# Patient Record
Sex: Female | Born: 1985 | Race: Black or African American | Hispanic: No | Marital: Married | Smoking: Current some day smoker
Health system: Southern US, Community
[De-identification: ages and names within clinical notes are randomized; demographics above are authoritative.]

## PROBLEM LIST (undated history)

## (undated) DIAGNOSIS — F419 Anxiety disorder, unspecified: Secondary | ICD-10-CM

## (undated) DIAGNOSIS — F32A Depression, unspecified: Secondary | ICD-10-CM

## (undated) DIAGNOSIS — J45909 Unspecified asthma, uncomplicated: Secondary | ICD-10-CM

## (undated) DIAGNOSIS — F329 Major depressive disorder, single episode, unspecified: Secondary | ICD-10-CM

## (undated) HISTORY — PX: TUBAL LIGATION: SHX77

## (undated) HISTORY — DX: Depression, unspecified: F32.A

## (undated) HISTORY — DX: Anxiety disorder, unspecified: F41.9

---
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
## 2016-10-07 ENCOUNTER — Emergency Department
Admission: EM | Admit: 2016-10-07 | Discharge: 2016-10-07 | Disposition: A | Discharge status: Home / Self Care | Payer: Federal, State, Local not specified - PPO | Attending: Emergency Medicine | Admitting: Emergency Medicine

## 2016-10-07 DIAGNOSIS — R110 Nausea: Secondary | ICD-10-CM | POA: Insufficient documentation

## 2016-10-07 DIAGNOSIS — X501XXA Overexertion from prolonged static or awkward postures, initial encounter: Secondary | ICD-10-CM | POA: Insufficient documentation

## 2016-10-07 DIAGNOSIS — Y9389 Activity, other specified: Secondary | ICD-10-CM | POA: Diagnosis not present

## 2016-10-07 DIAGNOSIS — M62830 Muscle spasm of back: Secondary | ICD-10-CM

## 2016-10-07 DIAGNOSIS — J45909 Unspecified asthma, uncomplicated: Secondary | ICD-10-CM | POA: Diagnosis not present

## 2016-10-07 DIAGNOSIS — Y999 Unspecified external cause status: Secondary | ICD-10-CM | POA: Insufficient documentation

## 2016-10-07 DIAGNOSIS — S29012A Strain of muscle and tendon of back wall of thorax, initial encounter: Secondary | ICD-10-CM | POA: Diagnosis not present

## 2016-10-07 DIAGNOSIS — F17200 Nicotine dependence, unspecified, uncomplicated: Secondary | ICD-10-CM | POA: Insufficient documentation

## 2016-10-07 DIAGNOSIS — Y929 Unspecified place or not applicable: Secondary | ICD-10-CM | POA: Insufficient documentation

## 2016-10-07 DIAGNOSIS — T148XXA Other injury of unspecified body region, initial encounter: Secondary | ICD-10-CM

## 2016-10-07 DIAGNOSIS — S299XXA Unspecified injury of thorax, initial encounter: Secondary | ICD-10-CM | POA: Diagnosis present

## 2016-10-07 HISTORY — DX: Unspecified asthma, uncomplicated: J45.909

## 2016-10-07 LAB — URINALYSIS COMPLETE WITH MICROSCOPIC (ARMC ONLY)
BACTERIA UA: NONE SEEN
Bilirubin Urine: NEGATIVE
GLUCOSE, UA: NEGATIVE mg/dL
HGB URINE DIPSTICK: NEGATIVE
Ketones, ur: NEGATIVE mg/dL
Leukocytes, UA: NEGATIVE
Nitrite: NEGATIVE
Protein, ur: NEGATIVE mg/dL
Specific Gravity, Urine: 1.028 (ref 1.005–1.030)
pH: 6 (ref 5.0–8.0)

## 2016-10-07 LAB — POCT PREGNANCY, URINE: PREG TEST UR: NEGATIVE

## 2016-10-07 MED ORDER — KETOROLAC TROMETHAMINE 30 MG/ML IJ SOLN
30.0000 mg | Freq: Once | INTRAMUSCULAR | Status: AC
  Administered 2016-10-07: 30 mg via INTRAMUSCULAR

## 2016-10-07 MED ORDER — ONDANSETRON 4 MG PO TBDP
4.0000 mg | ORAL_TABLET | Freq: Once | ORAL | Status: AC
  Administered 2016-10-07: 4 mg via ORAL
  Filled 2016-10-07: qty 1

## 2016-10-07 MED ORDER — NAPROXEN 500 MG PO TABS: 500.0000 mg | ORAL_TABLET | Freq: Two times a day (BID) | ORAL | 0 refills | Status: DC

## 2016-10-07 MED ORDER — CYCLOBENZAPRINE HCL 10 MG PO TABS: 10.0000 mg | ORAL_TABLET | Freq: Three times a day (TID) | ORAL | 0 refills | Status: DC | PRN

## 2016-10-07 MED ORDER — KETOROLAC TROMETHAMINE 30 MG/ML IJ SOLN
INTRAMUSCULAR | Status: AC
  Administered 2016-10-07: 30 mg via INTRAMUSCULAR
  Filled 2016-10-07: qty 1

## 2016-10-07 NOTE — ED Provider Notes (Signed)
[facility] Emergency Department Provider Note  ____________________________________________  Time seen: Approximately 8:35 AM  I have reviewed the triage vital signs and the nursing notes.   HISTORY  Chief Complaint Back Pain    HPI Kaitlyn Fry is a 30 y.o. female, NAD,  who presents to the emergency department for a one day history of left back pain. Patient reports that the pain began suddenly yesterday after she bent over to pick up a toy at the child's birthday party. States since that time the pain has increased and has not been alleviated by taking BC powder or applying heating pads. The pain is in the left mid back and does not radiate. Her husband who is at the bedside states he noted an "knot" about the left back that he attempted to massage last night. She states that the pain is similar to kidney stone pain that she had several years ago but has not noted any changes in urination patterns, dysuria or hematuria. Has had some mild nausea but states this can be chronic in nature for her. Denies abdominal pain, vomiting or diarrhea. Has had no chest pain or shortness of breath. Has not noted any rashes or skin sores. Denies headaches or neck pain.   Past Medical History:  Diagnosis Date  . Asthma     There are no active problems to display for this patient.   History reviewed. No pertinent surgical history.  Prior to Admission medications   Medication Sig Start Date End Date Taking? Authorizing Provider  cyclobenzaprine (FLEXERIL) 10 MG tablet Take 1 tablet (10 mg total) by mouth 3 (three) times daily as needed for muscle spasms. 10/07/16   Jami L Hagler, PA-C  naproxen (NAPROSYN) 500 MG tablet Take 1 tablet (500 mg total) by mouth 2 (two) times daily with a meal. 10/07/16   Jami L Hagler, PA-C    Allergies Patient has no known allergies.  No family history on file.  Social History Social History  Substance Use Topics  . Smoking status:  Current Some Day Smoker  . Smokeless tobacco: Never Used  . Alcohol use Yes     Review of Systems  Constitutional: No fever/chills Cardiovascular: No chest pain. Respiratory:  No shortness of breath. No wheezing.  Gastrointestinal: Positive for nausea. No abdominal pain, vomiting.  No diarrhea.  No constipation. Genitourinary: Negative for dysuria, hematuria. No urinary hesitancy, urgency or increased frequency. Musculoskeletal: Positive left sided back pain without radiation. Skin: Negative for rash, redness, swelling, bruising, skin sores. Neurological: Negative for headaches. No numbness, weakness, tingling. 10-point ROS otherwise negative.  ____________________________________________   PHYSICAL EXAM:  VITAL SIGNS: ED Triage Vitals [10/07/16 0833]  Enc Vitals Group     BP 106/70     Pulse Rate 73     Resp 18     Temp 97.7 F (36.5 C)     Temp Source Oral     SpO2 100 %     Weight      Height      Head Circumference      Peak Flow      Pain Score      Pain Loc      Pain Edu?      Excl. in GC?      Constitutional: Alert and oriented. Well appearing and in no acute distress. Eyes: Conjunctivae are normal.  Head: Atraumatic.   Neck: No cervical spine tenderness to palpation. Supple with full range of motion. Cardiovascular: Normal rate, regular  rhythm. Normal S1 and S2.  Good peripheral circulation. Respiratory: Normal respiratory effort without tachypnea or retractions. Lungs CTABWith breath sounds noted in all lung fields. No wheeze, rhonchi, rales. Gastrointestinal: Soft and nontender without distention or guarding in all quadrants. No rebound or rigidity. Normoactive bowel sounds heard in all quadrants.  No CVA tenderness. Musculoskeletal: Tenderness to palpation of the left mid back with diffuse left back muscle spasm. Strength of bilateral lower extremities is 5 out of 5 and equal. No lower extremity tenderness nor edema.  No joint effusions. Neurologic:   Normal speech and language. No gross focal neurologic deficits are appreciated.  Skin:  Skin is warm, dry and intact. No rash, redness, swelling, bruising, skin sores noted. Psychiatric: Mood and affect are normal. Speech and behavior are normal. Patient exhibits appropriate insight and judgement.   ____________________________________________   LABS (all labs ordered are listed, but only abnormal results are displayed)  Labs Reviewed  URINALYSIS COMPLETEWITH MICROSCOPIC ([facility] ONLY) - Abnormal; Notable for the following:       Result Value   Color, Urine YELLOW (*)    APPearance CLEAR (*)    Squamous Epithelial / LPF 0-5 (*)    All other components within normal limits  POC URINE PREG, ED  POCT PREGNANCY, URINE   ____________________________________________  EKG  None ____________________________________________  RADIOLOGY  None ____________________________________________    PROCEDURES  Procedure(s) performed: None   Procedures   Medications  ondansetron (ZOFRAN-ODT) disintegrating tablet 4 mg (4 mg Oral Given 10/07/16 0921)  ketorolac (TORADOL) 30 MG/ML injection 30 mg (30 mg Intramuscular Given 10/07/16 0959)     ____________________________________________   INITIAL IMPRESSION / ASSESSMENT AND PLAN / ED COURSE  Pertinent labs & imaging results that were available during my care of the patient were reviewed by me and considered in my medical decision making (see chart for details).  Clinical Course     Patient's diagnosis is consistent with muscle spasm and strain of back. Patient will be discharged home with prescriptions for flexeril and naprosyn. Patient tolerated Toradol IM which was given in the emergency department prior to discharge. Patient [facility] if symptoms persist past this treatment course. Patient is given ED precautions to return to the ED for any worsening or new symptoms.     ____________________________________________  FINAL CLINICAL IMPRESSION(S) / ED DIAGNOSES  Final diagnoses:  Muscle spasm of back  Muscle strain      NEW MEDICATIONS STARTED DURING THIS VISIT:  Discharge Medication List as of 10/07/2016  9:57 AM    START taking these medications   Details  cyclobenzaprine (FLEXERIL) 10 MG tablet Take 1 tablet (10 mg total) by mouth 3 (three) times daily as needed for muscle spasms., Starting Mon 10/07/2016, Print    naproxen (NAPROSYN) 500 MG tablet Take 1 tablet (500 mg total) by mouth 2 (two) times daily with a meal., Starting Mon 10/07/2016, Print             Hope PigeonJami L Hagler, PA-C 10/07/16 1037    Sharman CheekPhillip Stafford, MD 10/08/16 0745

## 2016-10-07 NOTE — ED Triage Notes (Signed)
Pt c/o left flank into lower back pain since yesterday, denies injury..Marland Kitchen

## 2017-03-04 ENCOUNTER — Ambulatory Visit: Payer: Self-pay | Admitting: Obstetrics and Gynecology

## 2017-07-28 ENCOUNTER — Emergency Department
Admission: EM | Admit: 2017-07-28 | Discharge: 2017-07-28 | Disposition: A | Discharge status: Home / Self Care | Payer: Federal, State, Local not specified - PPO | Attending: Emergency Medicine | Admitting: Emergency Medicine

## 2017-07-28 ENCOUNTER — Encounter: Payer: Self-pay | Admitting: Emergency Medicine

## 2017-07-28 DIAGNOSIS — K529 Noninfective gastroenteritis and colitis, unspecified: Secondary | ICD-10-CM | POA: Insufficient documentation

## 2017-07-28 DIAGNOSIS — R112 Nausea with vomiting, unspecified: Secondary | ICD-10-CM | POA: Diagnosis present

## 2017-07-28 DIAGNOSIS — J45909 Unspecified asthma, uncomplicated: Secondary | ICD-10-CM | POA: Insufficient documentation

## 2017-07-28 DIAGNOSIS — Z79899 Other long term (current) drug therapy: Secondary | ICD-10-CM | POA: Diagnosis not present

## 2017-07-28 DIAGNOSIS — F17200 Nicotine dependence, unspecified, uncomplicated: Secondary | ICD-10-CM | POA: Insufficient documentation

## 2017-07-28 MED ORDER — SODIUM CHLORIDE 0.9 % IV SOLN: 1000.0000 mL | Freq: Once | INTRAVENOUS | Status: DC

## 2017-07-28 MED ORDER — ONDANSETRON 4 MG PO TBDP: 4.0000 mg | ORAL_TABLET | Freq: Once | ORAL | Status: DC

## 2017-07-28 MED ORDER — ONDANSETRON 4 MG PO TBDP: 4.0000 mg | ORAL_TABLET | Freq: Three times a day (TID) | ORAL | 0 refills | Status: DC | PRN

## 2017-07-28 NOTE — ED Notes (Addendum)
This RN explained to patient that MD placed orders for blood work and for patient to receive IV fluids due to N/V. Pt states "You're going to have to butterfly me and start an IV." This RN explained that blood work could be obtained when starting an IV. Pt then states, "No I'm telling you you're going to have to use a butterfly because I'm a hard stick". This RN explained that if a butterfly was used then patient would not be able to receive IV fluids through it nor would any tests be able to be done should they be ordered. Pt then states, "what's the smallest needle you have, I don't do needles and I'm a hard stick!" This RN attempted to explain that a 22g would also suffice for any potential meds/testing that MD may order, pt states, "that's not what I asked you, I asked you what's the smallest gauge needle you have!" This RN explained the smallest gauge possible was one used for infants, pt states "well that's the one you're going to use on me!" This RN spoke with MD regarding patient demands, per MD hold off on drawing blood at this time.

## 2017-07-28 NOTE — ED Notes (Signed)
Pt requesting to speak with charge at this time, Zollie Scale, RN at bedside to speak with patient.

## 2017-07-28 NOTE — ED Notes (Addendum)
Tammy Sours, RN at bedside at this time to speak with patient regarding patient complaints.

## 2017-07-28 NOTE — ED Notes (Signed)
MD to bedside at this time 

## 2017-07-28 NOTE — ED Triage Notes (Signed)
Pt to ed with c/o abd pain, n/v/d that started last night.

## 2017-07-28 NOTE — ED Notes (Signed)
Pt refused to sign for discharge papers, discharge papers given to pt, pt refused discharge vitals

## 2017-07-28 NOTE — ED Notes (Signed)
Pt requesting to speak to someone in charge concerning care received while here.  This nurse arrived to room and sat at the foot of the stretcher with mother patient present at bedside. Kaitlyn Fry also present in room. Pt complaining of the change in care that was initiated on arrival that was stated by [place] Fry (primary Fry), pt stated "I am a hard stick" and requested a small needle, the pt interpreted the response from [facility] as "rude" with the way she placed the IV supplies down on the counter.  Kaitlyn Fry left the room to assist with another pt and during this time the MD (Dr.Kinner) arrived to the room for evaluation, per the pt" the Dr stated that we were no longer going to obtain blood work due to being difficult stick before he even evaluated me",  The patient stated " you all are just pushing me out and not treating me",  This Fry listened to the patient side and then this Fry explained the process that we do in the ED, the pt was informed that we have Nurse driven protocols that can be started before MD eval, and on some occasions when the MD does there evaluation sometimes those protocols are not needed and can be canceled.  The patient stated " the doctor said before evaluating me that the blood work is canceled".  After having this conversation the patient excused the Fry and Kaitlyn Fry from the room and requested her discharge instruction, and stating " you all are not treating because I am black "

## 2017-07-28 NOTE — ED Notes (Signed)
Pt standing at nursing desk requesting names of staff who interacted with her.  Pt requesting to speak to the director of the ED, pt was informed that this RN was one of the directors here and present currently.  Pt requesting to speak to someone higher than this RN. Pt was given the phone number of "office of patient experience" [phone] , pt refused at first and said I want to speak to someone, this RN informed patient that this is the process where any and all complaints of care go through and that someone there will follow up with the complaint. Pt verbalized understanding, received the number and then ambulated to the lobby.

## 2017-07-28 NOTE — ED Provider Notes (Addendum)
[facility] Emergency Department Provider Note   ____________________________________________    I have reviewed the triage vital signs and the nursing notes.   HISTORY  Chief Complaint Emesis     HPI Kaitlyn Fry is a 31 y.o. female who presents with complaints of nausea vomiting and diarrhea which started at approximately 4 AM. Patient reports she thinks this was related to the food that she ate last night. She says after she ate dinner she did not feel very well and went to sleep soon thereafter, she woke up at 4 with nausea. She reports she has vomited 4 times this morning, she has also had watery diarrhea.   Past Medical History:  Diagnosis Date  . Asthma     There are no active problems to display for this patient.   History reviewed. No pertinent surgical history.  Prior to Admission medications   Medication Sig Start Date End Date Taking? Authorizing Provider  cyclobenzaprine (FLEXERIL) 10 MG tablet Take 1 tablet (10 mg total) by mouth 3 (three) times daily as needed for muscle spasms. 10/07/16   Hagler, Jami L, PA-C  naproxen (NAPROSYN) 500 MG tablet Take 1 tablet (500 mg total) by mouth 2 (two) times daily with a meal. 10/07/16   Hagler, Jami L, PA-C  ondansetron (ZOFRAN ODT) 4 MG disintegrating tablet Take 1 tablet (4 mg total) by mouth every 8 (eight) hours as needed for nausea or vomiting. 07/28/17   Jene Every, MD     Allergies Patient has no known allergies.  History reviewed. No pertinent family history.  Social History Social History  Substance Use Topics  . Smoking status: Current Some Day Smoker  . Smokeless tobacco: Never Used  . Alcohol use Yes    Review of Systems  Constitutional: No fever/chills   Gastrointestinal: Mild abdominal cramping upper abdomen Genitourinary: Negative for dysuria. Musculoskeletal: Positive myalgias    ____________________________________________   PHYSICAL EXAM:  VITAL  SIGNS: ED Triage Vitals  Enc Vitals Group     BP 07/28/17 0933 126/68     Pulse Rate 07/28/17 0933 67     Resp 07/28/17 0933 16     Temp 07/28/17 0933 (!) 89.2 F (31.8 C)     Temp Source 07/28/17 0933 Oral     SpO2 07/28/17 0933 98 %     Weight 07/28/17 0934 74.8 kg (165 lb)     Height --      Head Circumference --      Peak Flow --      Pain Score 07/28/17 0933 10     Pain Loc --      Pain Edu? --      Excl. in GC? --     Constitutional: Alert and oriented. No acute distress.     Nose: No congestion/rhinnorhea. Mouth/Throat: Mucous membranes are moist.    Cardiovascular: Normal rate, regular rhythm.   Good peripheral circulation. Respiratory: Normal respiratory effort.  No retractions.  Gastrointestinal: Soft and nontender. No distention.  Genitourinary: deferred Musculoskeletal:  Warm and well perfused Neurologic:  Normal speech and language. No gross focal neurologic deficits are appreciated.  Skin:  Skin is warm, dry and intact. No rash noted. Psychiatric: Mood and affect are normal. Speech and behavior are normal.  ____________________________________________   LABS (all labs ordered are listed, but only abnormal results are displayed)  Labs Reviewed - No data to display ____________________________________________  EKG  None ____________________________________________  RADIOLOGY  None ____________________________________________   PROCEDURES  Procedure(s) performed: No    Critical Care performed: No ____________________________________________   INITIAL IMPRESSION / ASSESSMENT AND PLAN / ED COURSE  Pertinent labs & imaging results that were available during my care of the patient were reviewed by me and considered in my medical decision making (see chart for details).  Patient well-appearing and in no acute distress. Initial temperature was erroneous measurement, she is not hyperthermic.  Abdomen exam is normal. Symptoms most consistent  with likely viral gastroenteritis as she reports mild myalgias. No sick contacts reported. No recent travel. Patient has a fear of needles, we will avoid labs as I do not think it would be beneficial and give by mouth Zofran and treat symptoms.   ----------------------------------------- 11:01 AM on 07/28/2017 -----------------------------------------  Patient is apparently upset that no labs were done however given her exam is not medically necessary, this was explained to her.     ____________________________________________   FINAL CLINICAL IMPRESSION(S) / ED DIAGNOSES  Final diagnoses:  Gastroenteritis      NEW MEDICATIONS STARTED DURING THIS VISIT:  New Prescriptions   ONDANSETRON (ZOFRAN ODT) 4 MG DISINTEGRATING TABLET    Take 1 tablet (4 mg total) by mouth every 8 (eight) hours as needed for nausea or vomiting.     Note:  This document was prepared using Dragon voice recognition software and may include unintentional dictation errors.    Jene Every, MD 07/28/17 1035    Jene Every, MD 07/28/17 1101

## 2017-10-24 ENCOUNTER — Ambulatory Visit
Admission: RE | Admit: 2017-10-24 | Payer: Federal, State, Local not specified - PPO | Source: Ambulatory Visit | Admitting: Unknown Physician Specialty

## 2017-10-24 ENCOUNTER — Encounter: Admission: RE | Payer: Self-pay | Source: Ambulatory Visit

## 2017-10-24 SURGERY — TONSILLECTOMY
Anesthesia: General

## 2018-02-02 DIAGNOSIS — Z113 Encounter for screening for infections with a predominantly sexual mode of transmission: Secondary | ICD-10-CM | POA: Diagnosis not present

## 2018-02-10 DIAGNOSIS — M25562 Pain in left knee: Secondary | ICD-10-CM | POA: Diagnosis not present

## 2018-02-10 DIAGNOSIS — M25561 Pain in right knee: Secondary | ICD-10-CM | POA: Diagnosis not present

## 2018-02-20 DIAGNOSIS — G43909 Migraine, unspecified, not intractable, without status migrainosus: Secondary | ICD-10-CM | POA: Diagnosis not present

## 2018-02-20 DIAGNOSIS — R1031 Right lower quadrant pain: Secondary | ICD-10-CM | POA: Diagnosis not present

## 2018-03-23 DIAGNOSIS — J45998 Other asthma: Secondary | ICD-10-CM | POA: Diagnosis not present

## 2018-04-16 DIAGNOSIS — Y939 Activity, unspecified: Secondary | ICD-10-CM | POA: Diagnosis not present

## 2018-04-16 DIAGNOSIS — S63501A Unspecified sprain of right wrist, initial encounter: Secondary | ICD-10-CM | POA: Diagnosis not present

## 2018-04-17 DIAGNOSIS — J358 Other chronic diseases of tonsils and adenoids: Secondary | ICD-10-CM | POA: Diagnosis not present

## 2018-04-17 DIAGNOSIS — F17210 Nicotine dependence, cigarettes, uncomplicated: Secondary | ICD-10-CM | POA: Diagnosis not present

## 2018-05-11 DIAGNOSIS — K080 Exfoliation of teeth due to systemic causes: Secondary | ICD-10-CM | POA: Diagnosis not present

## 2018-06-03 DIAGNOSIS — G43909 Migraine, unspecified, not intractable, without status migrainosus: Secondary | ICD-10-CM | POA: Diagnosis not present

## 2018-06-03 DIAGNOSIS — F4310 Post-traumatic stress disorder, unspecified: Secondary | ICD-10-CM | POA: Diagnosis not present

## 2018-06-03 DIAGNOSIS — R112 Nausea with vomiting, unspecified: Secondary | ICD-10-CM | POA: Diagnosis not present

## 2018-06-16 ENCOUNTER — Ambulatory Visit (INDEPENDENT_AMBULATORY_CARE_PROVIDER_SITE_OTHER): Payer: Federal, State, Local not specified - PPO | Admitting: Podiatry

## 2018-06-16 ENCOUNTER — Ambulatory Visit: Payer: Non-veteran care

## 2018-06-16 ENCOUNTER — Encounter: Payer: Self-pay | Admitting: Podiatry

## 2018-06-16 DIAGNOSIS — M2141 Flat foot [pes planus] (acquired), right foot: Secondary | ICD-10-CM

## 2018-06-16 DIAGNOSIS — B353 Tinea pedis: Secondary | ICD-10-CM

## 2018-06-16 DIAGNOSIS — B351 Tinea unguium: Secondary | ICD-10-CM

## 2018-06-16 DIAGNOSIS — M2142 Flat foot [pes planus] (acquired), left foot: Principal | ICD-10-CM

## 2018-06-16 MED ORDER — TERBINAFINE HCL 250 MG PO TABS: 250.0000 mg | ORAL_TABLET | Freq: Every day | ORAL | 0 refills | Status: DC

## 2018-06-19 NOTE — Progress Notes (Signed)
   Subjective:  32 year old female presenting today with a chief complaint of constant throbbing pain of bilateral feet, left worse than right, that has been ongoing for several years. She is also concerned for fungal nails and athlete's foot of the left foot. She has not done anything to treat the fungus but has been taking Ibuprofen, applying OTC foot pain creams and ice therapy for the pain. Patient is here for further evaluation and treatment.   Past Medical History:  Diagnosis Date  . Asthma        Objective/Physical Exam General: The patient is alert and oriented x3 in no acute distress.  Dermatology: Pruritus to the interdigital areas of the left foot with hyperkeratosis. Hyperkeratotic, discolored, thickened, onychodystrophy of nails noted bilaterally. Skin is warm, dry and supple bilateral lower extremities. Negative for open lesions or macerations.  Vascular: Palpable pedal pulses bilaterally. No edema or erythema noted. Capillary refill within normal limits.  Neurological: Epicritic and protective threshold grossly intact bilaterally.   Musculoskeletal Exam: Range of motion within normal limits to all pedal and ankle joints bilateral. Muscle strength 5/5 in all groups bilateral.  Upon weightbearing there is a medial longitudinal arch collapse bilaterally. Remove foot valgus noted to the bilateral lower extremities with excessive pronation upon mid stance.  Radiographic Exam:  Normal osseous mineralization. Joint spaces preserved. No fracture/dislocation/boney destruction.   Pes planus noted on radiographic exam lateral views. Decreased calcaneal inclination and metatarsal declination angle is noted. Anterior break in the cyma line noted on lateral views. Medial talar head to deviation noted on AP radiograph.   Assessment: 1. pes planus bilateral 2. Onychomycosis bilateral nails 1-5 3. Tinea pedis left    Plan of Care:  1. Patient was evaluated. X-Rays reviewed.  2.  Prescription for custom molded orthotics given to [facility].  3. Prescription for Lamisil 250 mg #90 provided to patient.  4. Recommended good shoe gear.  5. Return to clinic as needed.     Felecia ShellingBrent M. Anndee Connett, DPM [facility]  Dr. Felecia ShellingBrent M. Selicia Windom, DPM    [address]                                        [place], [place] [address]                Office [phone]  Fax [phone]

## 2018-06-22 ENCOUNTER — Emergency Department: Payer: Federal, State, Local not specified - PPO

## 2018-06-22 ENCOUNTER — Other Ambulatory Visit: Payer: Self-pay

## 2018-06-22 ENCOUNTER — Encounter: Payer: Self-pay | Admitting: Emergency Medicine

## 2018-06-22 ENCOUNTER — Emergency Department
Admission: EM | Admit: 2018-06-22 | Discharge: 2018-06-22 | Disposition: A | Discharge status: Home / Self Care | Payer: Federal, State, Local not specified - PPO | Attending: Emergency Medicine | Admitting: Emergency Medicine

## 2018-06-22 DIAGNOSIS — J45909 Unspecified asthma, uncomplicated: Secondary | ICD-10-CM | POA: Insufficient documentation

## 2018-06-22 DIAGNOSIS — F17200 Nicotine dependence, unspecified, uncomplicated: Secondary | ICD-10-CM | POA: Insufficient documentation

## 2018-06-22 DIAGNOSIS — N3090 Cystitis, unspecified without hematuria: Secondary | ICD-10-CM

## 2018-06-22 DIAGNOSIS — T6291XA Toxic effect of unspecified noxious substance eaten as food, accidental (unintentional), initial encounter: Secondary | ICD-10-CM | POA: Diagnosis not present

## 2018-06-22 DIAGNOSIS — R1111 Vomiting without nausea: Secondary | ICD-10-CM | POA: Diagnosis not present

## 2018-06-22 DIAGNOSIS — IMO0001 Reserved for inherently not codable concepts without codable children: Secondary | ICD-10-CM

## 2018-06-22 LAB — POCT PREGNANCY, URINE: PREG TEST UR: NEGATIVE

## 2018-06-22 LAB — URINALYSIS, COMPLETE (UACMP) WITH MICROSCOPIC
BILIRUBIN URINE: NEGATIVE
Glucose, UA: NEGATIVE mg/dL
Ketones, ur: NEGATIVE mg/dL
Nitrite: NEGATIVE
PROTEIN: NEGATIVE mg/dL
SPECIFIC GRAVITY, URINE: 1.019 (ref 1.005–1.030)
pH: 5 (ref 5.0–8.0)

## 2018-06-22 MED ORDER — ONDANSETRON 4 MG PO TBDP: 4.0000 mg | ORAL_TABLET | Freq: Three times a day (TID) | ORAL | 0 refills | Status: DC | PRN

## 2018-06-22 MED ORDER — CEPHALEXIN 500 MG PO CAPS: 500.0000 mg | ORAL_CAPSULE | Freq: Two times a day (BID) | ORAL | 0 refills | Status: AC

## 2018-06-22 MED ORDER — GI COCKTAIL ~~LOC~~
30.0000 mL | Freq: Once | ORAL | Status: AC
  Administered 2018-06-22: 30 mL via ORAL
  Filled 2018-06-22: qty 30

## 2018-06-22 MED ORDER — GI COCKTAIL ~~LOC~~
ORAL | Status: AC
  Administered 2018-06-22: 30 mL via ORAL
  Filled 2018-06-22: qty 30

## 2018-06-22 NOTE — ED Provider Notes (Signed)
[facility] Emergency Department Provider Note  ____________________________________________  Time seen: Approximately 1:01 PM  I have reviewed the triage vital signs and the nursing notes.   HISTORY  Chief Complaint Emesis and Diarrhea    HPI Kaitlyn Fry is a 32 y.o. female that presents emergency department for evaluation of vomiting and diarrhea for 2 days.  Patient has had 2 episodes of vomiting this morning and 3 episodes of diarrhea.  No blood or mucus in diarrhea.  She has had some overall abdominal discomfort, worse in the upper abdomen.  Symptoms started when patient was [facility] [employer] yesterday.  She had cod and scallops.  She had originally requested substitution for scallops because she gets sick with scallops but ended up eating the scallops.  No fever, chills, urinary symptoms, vaginal discharge, back pain.  Past Medical History:  Diagnosis Date  . Asthma     There are no active problems to display for this patient.   History reviewed. No pertinent surgical history.  Prior to Admission medications   Medication Sig Start Date End Date Taking? Authorizing Provider  cephALEXin (KEFLEX) 500 MG capsule Take 1 capsule (500 mg total) by mouth 2 (two) times daily for 10 days. 06/22/18 07/02/18  Enid DerryWagner, Tremeka Helbling, PA-C  ondansetron (ZOFRAN ODT) 4 MG disintegrating tablet Take 1 tablet (4 mg total) by mouth every 8 (eight) hours as needed for nausea or vomiting. 06/22/18   Enid DerryWagner, Katlyn Muldrew, PA-C  terbinafine (LAMISIL) 250 MG tablet Take 1 tablet (250 mg total) by mouth daily. 06/16/18   Felecia ShellingEvans, Brent M, DPM    Allergies Patient has no known allergies.  No family history on file.  Social History Social History   Tobacco Use  . Smoking status: Current Some Day Smoker  . Smokeless tobacco: Never Used  Substance Use Topics  . Alcohol use: Yes  . Drug use: No     Review of Systems  Constitutional: No fever/chills Cardiovascular: No chest  pain. Respiratory: No SOB. Gastrointestinal: Positive for nausea, no vomiting.  Musculoskeletal: Negative for musculoskeletal pain. Skin: Negative for rash, abrasions, lacerations, ecchymosis. Neurological: Negative for headaches, numbness or tingling   ____________________________________________   PHYSICAL EXAM:  VITAL SIGNS: ED Triage Vitals [06/22/18 1200]  Enc Vitals Group     BP 119/80     Pulse Rate 78     Resp 20     Temp 99 F (37.2 C)     Temp Source Oral     SpO2 100 %     Weight 156 lb (70.8 kg)     Height 5' (1.524 m)     Head Circumference      Peak Flow      Pain Score 0     Pain Loc      Pain Edu?      Excl. in GC?      Constitutional: Alert and oriented. Well appearing and in no acute distress. Eyes: Conjunctivae are normal. PERRL. EOMI. Head: Atraumatic. ENT:      Ears:      Nose: No congestion/rhinnorhea.      Mouth/Throat: Mucous membranes are moist.  Neck: No stridor. Cardiovascular: Normal rate, regular rhythm.  Good peripheral circulation. Respiratory: Normal respiratory effort without tachypnea or retractions. Lungs CTAB. Good air entry to the bases with no decreased or absent breath sounds. Gastrointestinal: Bowel sounds 4 quadrants. Soft and nontender to palpation. No guarding or rigidity. No palpable masses. No distention. No CVA tenderness. Musculoskeletal: Full range of motion  to all extremities. No gross deformities appreciated. Neurologic:  Normal speech and language. No gross focal neurologic deficits are appreciated.  Skin:  Skin is warm, dry and intact. No rash noted. Psychiatric: Mood and affect are normal. Speech and behavior are normal. Patient exhibits appropriate insight and judgement.   ____________________________________________   LABS (all labs ordered are listed, but only abnormal results are displayed)  Labs Reviewed  URINALYSIS, COMPLETE (UACMP) WITH MICROSCOPIC - Abnormal; Notable for the following components:       Result Value   Color, Urine YELLOW (*)    APPearance CLOUDY (*)    Hgb urine dipstick SMALL (*)    Leukocytes, UA LARGE (*)    Bacteria, UA RARE (*)    All other components within normal limits  URINE CULTURE  POC URINE PREG, ED  POCT PREGNANCY, URINE   ____________________________________________  EKG   ____________________________________________  RADIOLOGY Lexine Baton, personally viewed and evaluated these images (plain radiographs) as part of my medical decision making, as well as reviewing the written report by the radiologist.  Ct Renal Stone Study  Result Date: 06/22/2018 CLINICAL DATA:  Loss E a, vomiting and diarrhea since yesterday. Hematuria. EXAM: CT ABDOMEN AND PELVIS WITHOUT CONTRAST TECHNIQUE: Multidetector CT imaging of the abdomen and pelvis was performed following the standard protocol without IV contrast. COMPARISON:  None. FINDINGS: Lower chest: No acute abnormality. Hepatobiliary: No focal liver abnormality is seen. No gallstones, gallbladder wall thickening, or biliary dilatation. Pancreas: Unremarkable. No pancreatic ductal dilatation or surrounding inflammatory changes. Spleen: Normal in size without focal abnormality. Adrenals/Urinary Tract: Adrenal glands are unremarkable. Kidneys are normal, without renal calculi, focal lesion, or hydronephrosis. Bladder is unremarkable. Stomach/Bowel: Stomach is within normal limits. Appendix appears normal. No evidence of bowel wall thickening, distention, or inflammatory changes. Vascular/Lymphatic: No significant vascular findings are present. No enlarged abdominal or pelvic lymph nodes. Reproductive: The uterus is normal. There are cysts within the left ovary. Other: Small amount of free fluid is identified in the pelvis. Pelvic phleboliths are identified. Musculoskeletal: No acute or significant osseous findings. IMPRESSION: No acute abnormality identified in the abdomen pelvis. No bowel obstruction or diverticulitis.   The appendix is normal. No hydronephrosis bilaterally. Cysts within the left ovary, likely follicular. Small amount of free fluid identified in the pelvis, likely physiologic and may be in part due to recently ruptured follicular cyst. Electronically Signed   By: Sherian Rein M.D.   On: 06/22/2018 13:51    ____________________________________________    PROCEDURES  Procedure(s) performed:    Procedures    Medications  gi cocktail (Maalox,Lidocaine,Donnatal) (30 mLs Oral Given 06/22/18 1432)     ____________________________________________   INITIAL IMPRESSION / ASSESSMENT AND PLAN / ED COURSE  Pertinent labs & imaging results that were available during my care of the patient were reviewed by me and considered in my medical decision making (see chart for details).  Review of the [place] CSRS was performed in accordance of the [facility] prior to dispensing any controlled drugs.     Patient's diagnosis is consistent with food poisoning and urinary tract infection.  Vital signs and exam are reassuring.  Urinalysis consistent with infection.  No nephrolithiasis on CT.  CT results were given to patient.  Patient has not had any vomiting or diarrhea while in the emergency department.  She is able to keep fluids down without vomiting.  Abdomen is soft and nontender.  Symptoms are likely related to the scallops she ate yesterday.  Symptoms improved with  GI cocktail.  Patient will be discharged home with prescriptions for Zofran and Keflex. Patient is to follow up with primary care as directed. Patient is given ED precautions to return to the ED for any worsening or new symptoms.     ____________________________________________  FINAL CLINICAL IMPRESSION(S) / ED DIAGNOSES  Final diagnoses:  Food poisoning, accidental or unintentional, initial encounter  Cystitis      NEW MEDICATIONS STARTED DURING THIS VISIT:  ED Discharge Orders         Ordered    ondansetron (ZOFRAN ODT) 4 MG  disintegrating tablet  Every 8 hours PRN     06/22/18 1507    cephALEXin (KEFLEX) 500 MG capsule  2 times daily     06/22/18 1507              This chart was dictated using voice recognition software/Dragon. Despite best efforts to proofread, errors can occur which can change the meaning. Any change was purely unintentional.    Enid DerryWagner, Adeoluwa Silvers, PA-C 06/22/18 [id number]    Kinner, Robert, MD 06/23/18 [phone]

## 2018-06-22 NOTE — ED Triage Notes (Signed)
States began vomiting about 10 minutes after finished eating at restaurant yesterday. Quickly began diarrhea after. Denies abd pain. Believes it was the food.

## 2018-06-22 NOTE — ED Notes (Signed)
See triage note  Presents with some n/v/d   States it developed after eating yesterday  Last time vomited ot diarrhea was this am  Low grade fever noted on arrival

## 2018-06-22 NOTE — ED Notes (Signed)
See triage note  Presents with 

## 2018-06-24 LAB — URINE CULTURE

## 2018-07-08 ENCOUNTER — Telehealth: Payer: Self-pay | Admitting: Podiatry

## 2018-07-08 NOTE — Telephone Encounter (Signed)
[facility], Attn:[facility] Team B fax Number [phone].

## 2018-08-07 DIAGNOSIS — R109 Unspecified abdominal pain: Secondary | ICD-10-CM | POA: Diagnosis not present

## 2018-08-07 DIAGNOSIS — A599 Trichomoniasis, unspecified: Secondary | ICD-10-CM | POA: Diagnosis not present

## 2018-08-07 DIAGNOSIS — N760 Acute vaginitis: Secondary | ICD-10-CM | POA: Diagnosis not present

## 2018-08-07 DIAGNOSIS — H9319 Tinnitus, unspecified ear: Secondary | ICD-10-CM | POA: Diagnosis not present

## 2018-08-08 DIAGNOSIS — A5901 Trichomonal vulvovaginitis: Secondary | ICD-10-CM | POA: Diagnosis not present

## 2018-08-25 DIAGNOSIS — K080 Exfoliation of teeth due to systemic causes: Secondary | ICD-10-CM | POA: Diagnosis not present

## 2018-12-16 DIAGNOSIS — R509 Fever, unspecified: Secondary | ICD-10-CM | POA: Diagnosis not present

## 2018-12-16 DIAGNOSIS — J069 Acute upper respiratory infection, unspecified: Secondary | ICD-10-CM | POA: Diagnosis not present

## 2018-12-28 DIAGNOSIS — K080 Exfoliation of teeth due to systemic causes: Secondary | ICD-10-CM | POA: Diagnosis not present

## 2018-12-29 DIAGNOSIS — J029 Acute pharyngitis, unspecified: Secondary | ICD-10-CM | POA: Diagnosis not present

## 2019-04-20 DIAGNOSIS — N390 Urinary tract infection, site not specified: Secondary | ICD-10-CM | POA: Diagnosis not present

## 2019-06-05 DIAGNOSIS — M545 Low back pain: Secondary | ICD-10-CM | POA: Diagnosis not present

## 2019-07-23 DIAGNOSIS — K080 Exfoliation of teeth due to systemic causes: Secondary | ICD-10-CM | POA: Diagnosis not present

## 2019-10-18 ENCOUNTER — Ambulatory Visit: Payer: Non-veteran care | Admitting: Physician Assistant

## 2019-11-04 ENCOUNTER — Ambulatory Visit: Payer: Non-veteran care | Admitting: Physician Assistant

## 2019-11-08 ENCOUNTER — Ambulatory Visit (INDEPENDENT_AMBULATORY_CARE_PROVIDER_SITE_OTHER): Payer: Federal, State, Local not specified - PPO | Admitting: Adult Health

## 2019-11-08 ENCOUNTER — Encounter: Payer: Self-pay | Admitting: Adult Health

## 2019-11-08 ENCOUNTER — Other Ambulatory Visit: Payer: Self-pay

## 2019-11-08 VITALS — BP 122/80 | HR 62 | Temp 97.8°F | Resp 15 | Ht 60.0 in | Wt 160.0 lb

## 2019-11-08 DIAGNOSIS — F419 Anxiety disorder, unspecified: Secondary | ICD-10-CM

## 2019-11-08 DIAGNOSIS — Z6831 Body mass index (BMI) 31.0-31.9, adult: Secondary | ICD-10-CM

## 2019-11-08 DIAGNOSIS — Z0000 Encounter for general adult medical examination without abnormal findings: Secondary | ICD-10-CM | POA: Diagnosis not present

## 2019-11-08 DIAGNOSIS — N920 Excessive and frequent menstruation with regular cycle: Secondary | ICD-10-CM | POA: Insufficient documentation

## 2019-11-08 DIAGNOSIS — R102 Pelvic and perineal pain: Secondary | ICD-10-CM

## 2019-11-08 DIAGNOSIS — Z8742 Personal history of other diseases of the female genital tract: Secondary | ICD-10-CM

## 2019-11-08 DIAGNOSIS — R87619 Unspecified abnormal cytological findings in specimens from cervix uteri: Secondary | ICD-10-CM

## 2019-11-08 DIAGNOSIS — Z13220 Encounter for screening for lipoid disorders: Secondary | ICD-10-CM | POA: Diagnosis not present

## 2019-11-08 DIAGNOSIS — Z9851 Tubal ligation status: Secondary | ICD-10-CM

## 2019-11-08 DIAGNOSIS — F17200 Nicotine dependence, unspecified, uncomplicated: Secondary | ICD-10-CM

## 2019-11-08 DIAGNOSIS — N939 Abnormal uterine and vaginal bleeding, unspecified: Secondary | ICD-10-CM | POA: Insufficient documentation

## 2019-11-08 NOTE — Progress Notes (Addendum)
Patient: Kaitlyn Fry, Female    DOB: Nov 26, 1985, 34 y.o.   MRN: [id number] Visit Date: 11/08/2019  Today's Provider: Jairo Ben, FNP   Chief Complaint  Patient presents with  . Establish Care   Subjective:    New Patient  Kaitlyn Fry is a 33 y.o. female who presents today for health maintenance and establish care, patient reports that her former PCP was at [place] in [place] and states that her last pap was more then a year ago but was normal. She feels well. She reports she is not exercising . She reports she is sleeping fairly well on average 3-5hrs a night.   She has white thicker vaginal discharge before her menstrual cycle, discussed likely etiology.  Denies any vaginal symptoms. She denies any concerns for STD's. She does report history of abnormal pap. She has a history of abnormal PAP in past- she is unsure as to what was abnormal. Offered patient a record release she declined.  She reports she always has had " stomach issues " since having tubal ligation. 2012. She She had upper GI she reports in 2014 was told was normal by gastrointestinal MD at [facility] hospital. She reports she was told no follow up needed.   Denies any rectal or abnormal vaginal bleeding.  Pain not associated with eating or not eating. Denies reflux or burning. Denies constipation. Denies any trauma or injury.   No birth controls. She denies any concerns for STD's. Heavy menstraul cycles.    She has amitriptyline for anxiety and she was given that at the [place]. She had a counselor there.  She denies any need for refills. She reports she is doing well. She has no suicidal or homicidal ideations or intents.   Patient  denies any fever, body aches,chills, rash, chest pain, shortness of breath, nausea, vomiting, or diarrhea.   -----------------------------------------------------------------   Review of Systems  Constitutional: Negative.   HENT: Negative.   Eyes: Negative.   Respiratory:  Negative.   Cardiovascular: Negative.   Gastrointestinal: Positive for abdominal pain. Negative for abdominal distention, anal bleeding, blood in stool, constipation, diarrhea, nausea, rectal pain and vomiting.  Endocrine: Negative.   Genitourinary: Positive for vaginal discharge. Negative for decreased urine volume, difficulty urinating, dyspareunia, dysuria, enuresis, flank pain, frequency, genital sores, hematuria, menstrual problem, pelvic pain, urgency, vaginal bleeding and vaginal pain.  Musculoskeletal: Negative.   Skin: Negative.   Neurological: Negative.   Hematological: Negative.   Psychiatric/Behavioral: Negative for agitation, behavioral problems, confusion, decreased concentration, dysphoric mood, hallucinations, self-injury, sleep disturbance and suicidal ideas. The patient is nervous/anxious. The patient is not hyperactive.   All other systems reviewed and are negative.   Social History She  reports that she has been smoking. She has never used smokeless tobacco. She reports current alcohol use. She reports that she does not use drugs. Social History   Socioeconomic History  . Marital status: Married    Spouse name: Not on file  . Number of children: Not on file  . Years of education: Not on file  . Highest education level: Not on file  Occupational History  . Not on file  Tobacco Use  . Smoking status: Current Some Day Smoker  . Smokeless tobacco: Never Used  Substance and Sexual Activity  . Alcohol use: Yes  . Drug use: No  . Sexual activity: Not on file  Other Topics Concern  . Not on file  Social History Narrative  . Not on file  Social Determinants of Health   Financial Resource Strain:   . Difficulty of Paying Living Expenses: Not on file  Food Insecurity:   . Worried About Programme researcher, broadcasting/film/video in the Last Year: Not on file  . Ran Out of Food in the Last Year: Not on file  Transportation Needs:   . Lack of Transportation (Medical): Not on file  .  Lack of Transportation (Non-Medical): Not on file  Physical Activity:   . Days of Exercise per Week: Not on file  . Minutes of Exercise per Session: Not on file  Stress:   . Feeling of Stress : Not on file  Social Connections:   . Frequency of Communication with Friends and Family: Not on file  . Frequency of Social Gatherings with Friends and Family: Not on file  . Attends Religious Services: Not on file  . Active Member of Clubs or Organizations: Not on file  . Attends Banker Meetings: Not on file  . Marital Status: Not on file    Patient Active Problem List   Diagnosis Date Noted  . Abnormal uterine bleeding (AUB) 11/08/2019  . Pelvic pain in female 11/08/2019    Past Surgical History:  Procedure Laterality Date  . TUBAL LIGATION      Family History  No family status information on file.   Her family history is not on file.     No Known Allergies  Previous Medications   No medications on file    Patient Care Team: Flinchum, Eula Fried, FNP as PCP - General (Family Medicine)      Objective:   Today's Vitals   11/08/19 1030  BP: 122/80  Pulse: 62  Resp: 15  Temp: 97.8 F (36.6 C)  TempSrc: Oral  SpO2: 97%  Weight: 160 lb (72.6 kg)  Height: 5' (1.524 m)   Body mass index is 31.25 kg/m.;  Physical Exam Vitals reviewed.  Constitutional:      General: She is not in acute distress.    Appearance: She is well-developed. She is not diaphoretic.     Interventions: She is not intubated. HENT:     Head: Normocephalic and atraumatic.     Right Ear: External ear normal.     Left Ear: External ear normal.     Nose: Nose normal.     Mouth/Throat:     Pharynx: No oropharyngeal exudate.  Eyes:     General: Lids are normal. No scleral icterus.       Right eye: No discharge.        Left eye: No discharge.     Conjunctiva/sclera: Conjunctivae normal.     Right eye: Right conjunctiva is not injected. No exudate or hemorrhage.    Left eye: Left  conjunctiva is not injected. No exudate or hemorrhage.    Pupils: Pupils are equal, round, and reactive to light.  Neck:     Thyroid: No thyroid mass or thyromegaly.     Vascular: Normal carotid pulses. No carotid bruit, hepatojugular reflux or JVD.     Trachea: Trachea and phonation normal. No tracheal tenderness or tracheal deviation.     Meningeal: Brudzinski's sign and Kernig's sign absent.  Cardiovascular:     Rate and Rhythm: Normal rate and regular rhythm.     Pulses: Normal pulses.          Radial pulses are 2+ on the right side and 2+ on the left side.       Dorsalis pedis pulses  are 2+ on the right side and 2+ on the left side.       Posterior tibial pulses are 2+ on the right side and 2+ on the left side.     Heart sounds: Normal heart sounds, S1 normal and S2 normal. Heart sounds not distant. No murmur. No friction rub. No gallop.   Pulmonary:     Effort: Pulmonary effort is normal. No tachypnea, bradypnea, accessory muscle usage or respiratory distress. She is not intubated.     Breath sounds: Normal breath sounds. No stridor. No wheezing or rales.  Chest:     Chest wall: No tenderness.  Abdominal:     General: Bowel sounds are normal. There is no distension or abdominal bruit.     Palpations: Abdomen is soft. There is no shifting dullness, fluid wave, hepatomegaly, splenomegaly, mass or pulsatile mass.     Tenderness: There is no abdominal tenderness. There is no guarding or rebound.     Hernia: No hernia is present.  Genitourinary:    Comments: Declined/ deferred will see gynecology for breast and PAP smear/ vaginal exam  Musculoskeletal:        General: No tenderness or deformity. Normal range of motion.     Cervical back: Full passive range of motion without pain, normal range of motion and neck supple. No edema, erythema or rigidity. No spinous process tenderness or muscular tenderness. Normal range of motion.  Lymphadenopathy:     Head:     Right side of head: No  submental, submandibular, tonsillar, preauricular, posterior auricular or occipital adenopathy.     Left side of head: No submental, submandibular, tonsillar, preauricular, posterior auricular or occipital adenopathy.     Cervical: No cervical adenopathy.     Right cervical: No superficial, deep or posterior cervical adenopathy.    Left cervical: No superficial, deep or posterior cervical adenopathy.     Upper Body:     Right upper body: No supraclavicular or pectoral adenopathy.     Left upper body: No supraclavicular or pectoral adenopathy.  Skin:    General: Skin is warm and dry.     Coloration: Skin is not pale.     Findings: No abrasion, bruising, burn, ecchymosis, erythema, lesion, petechiae or rash.     Nails: There is no clubbing.  Neurological:     Mental Status: She is alert and oriented to person, place, and time.     GCS: GCS eye subscore is 4. GCS verbal subscore is 5. GCS motor subscore is 6.     Cranial Nerves: No cranial nerve deficit.     Sensory: No sensory deficit.     Motor: No tremor, atrophy, abnormal muscle tone or seizure activity.     Coordination: Coordination normal.     Gait: Gait normal.     Deep Tendon Reflexes: Reflexes are normal and symmetric. Reflexes normal. Babinski sign absent on the right side. Babinski sign absent on the left side.     Reflex Scores:      Tricep reflexes are 2+ on the right side and 2+ on the left side.      Bicep reflexes are 2+ on the right side and 2+ on the left side.      Brachioradialis reflexes are 2+ on the right side and 2+ on the left side.      Patellar reflexes are 2+ on the right side and 2+ on the left side.      Achilles reflexes are 2+ on the  right side and 2+ on the left side. Psychiatric:        Mood and Affect: Mood normal.        Speech: Speech normal.        Behavior: Behavior normal.        Thought Content: Thought content normal.        Judgment: Judgment normal.      Depression Screen PHQ 2/9 Scores  11/08/2019  PHQ - 2 Score 2  PHQ- 9 Score 14       Assessment & Plan:     Routine Health Maintenance and Physical Exam  Exercise Activities and Dietary recommendations Goals   None      There is no immunization history on file for this patient.  Health Maintenance  Topic Date Due  . HIV Screening  12/30/1999  . TETANUS/TDAP  12/30/2003  . PAP SMEAR-Modifier  12/29/2005  . INFLUENZA VACCINE  06/12/2019     Discussed health benefits of physical activity, and encouraged her to engage in regular exercise appropriate for her age and condition.    -------------------------------------------------------------------- 1. Routine adult health maintenance Labs today. Health maintenance discussed.  Healthy lifestyle/excercise.  - CBC with Differential/Platelet 005009 - Comprehensive Metabolic Panel (CMET) - TSH - Lipid panel - Ambulatory referral to Psychiatry  2. Abnormal cervical Papanicolaou smear, unspecified abnormal pap finding Reported patient unsure etiology in past. - Ambulatory referral to Obstetrics / Gynecology  3. Menorrhagia with regular cycle Chronic history will send to specialty.  - Ambulatory referral to Obstetrics / Gynecology  4. Smoker Smoking cessation advised, health risks associated with smoking and increased risk of cardiovascular disease and lung cancer. Patient not interested in quitting at this time.  - Ambulatory referral to Obstetrics / Gynecology  5. BMI 31.0-31.9,adult Diet / healthy lifestyle/ exercise recommended.   6. Personal history of ovarian cyst  - Ambulatory referral to Obstetrics / Gynecology  7. Pelvic pain in female Chronic  - Ambulatory referral to Obstetrics / Gynecology  8. History of tubal ligation - Ambulatory referral to Obstetrics / Gynecology  9. Anxiety Reviewed PHQ and GAD scoring. She is not interested in medication change/ alternatives  She is taking Amitriptyline daily for years, does not have bottle.  Denies the need for refill. She would like to see psychiatry again. Counseling recommended. Also may take a while to get in can also check [facility] in [place], while waiting on psychiatry appointment. She is advised to follow up with the office if any worsening symptoms or concerns.  - Ambulatory referral to Psychiatry  Discussed calling the office  if she has not heard from referrals within 2 weeks.  Return if symptoms worsen or fail to improve, for Go to Emergency room/ urgent care if worse, at any time for any worsening symptoms.  The entirety of the information documented in the History of Present Illness, Review of Systems and Physical Exam were personally obtained by me. Portions of this information were initially documented by the  Certified Medical Assistant whose name is documented in [place] and reviewed by me for thoroughness and accuracy.  I have personally performed the exam and reviewed the chart and it is accurate to the best of my knowledge.  Haematologist has been used and any errors in dictation or transcription are unintentional.  Kelby Aline. [facility]

## 2019-11-08 NOTE — Patient Instructions (Addendum)
Health Maintenance, Female Adopting a healthy lifestyle and getting preventive care are important in promoting health and wellness. Ask your health care provider about:  The right schedule for you to have regular tests and exams.  Things you can do on your own to prevent diseases and keep yourself healthy. What should I know about diet, weight, and exercise? Eat a healthy diet   Eat a diet that includes plenty of vegetables, fruits, low-fat dairy products, and lean protein.  Do not eat a lot of foods that are high in solid fats, added sugars, or sodium. Maintain a healthy weight Body mass index (BMI) is used to identify weight problems. It estimates body fat based on height and weight. Your health care provider can help determine your BMI and help you achieve or maintain a healthy weight. Get regular exercise Get regular exercise. This is one of the most important things you can do for your health. Most adults should:  Exercise for at least 150 minutes each week. The exercise should increase your heart rate and make you sweat (moderate-intensity exercise).  Do strengthening exercises at least twice a week. This is in addition to the moderate-intensity exercise.  Spend less time sitting. Even light physical activity can be beneficial. Watch cholesterol and blood lipids Have your blood tested for lipids and cholesterol at 34 years of age, then have this test every 5 years. Have your cholesterol levels checked more often if:  Your lipid or cholesterol levels are high.  You are older than 34 years of age.  You are at high risk for heart disease. What should I know about cancer screening? Depending on your health history and family history, you may need to have cancer screening at various ages. This may include screening for:  Breast cancer.  Cervical cancer.  Colorectal cancer.  Skin cancer.  Lung cancer. What should I know about heart disease, diabetes, and high blood  pressure? Blood pressure and heart disease  High blood pressure causes heart disease and increases the risk of stroke. This is more likely to develop in people who have high blood pressure readings, are of African descent, or are overweight.  Have your blood pressure checked: ? Every 3-5 years if you are 18-39 years of age. ? Every year if you are 40 years old or older. Diabetes Have regular diabetes screenings. This checks your fasting blood sugar level. Have the screening done:  Once every three years after age 40 if you are at a normal weight and have a low risk for diabetes.  More often and at a younger age if you are overweight or have a high risk for diabetes. What should I know about preventing infection? Hepatitis B If you have a higher risk for hepatitis B, you should be screened for this virus. Talk with your health care provider to find out if you are at risk for hepatitis B infection. Hepatitis C Testing is recommended for:  Everyone born from 1945 through 1965.  Anyone with known risk factors for hepatitis C. Sexually transmitted infections (STIs)  Get screened for STIs, including gonorrhea and chlamydia, if: ? You are sexually active and are younger than 34 years of age. ? You are older than 34 years of age and your health care provider tells you that you are at risk for this type of infection. ? Your sexual activity has changed since you were last screened, and you are at increased risk for chlamydia or gonorrhea. Ask your health care provider if   you are at risk.  Ask your health care provider about whether you are at high risk for HIV. Your health care provider may recommend a prescription medicine to help prevent HIV infection. If you choose to take medicine to prevent HIV, you should first get tested for HIV. You should then be tested every 3 months for as long as you are taking the medicine. Pregnancy  If you are about to stop having your period (premenopausal) and  you may become pregnant, seek counseling before you get pregnant.  Take 400 to 800 micrograms (mcg) of folic acid every day if you become pregnant.  Ask for birth control (contraception) if you want to prevent pregnancy. Osteoporosis and menopause Osteoporosis is a disease in which the bones lose minerals and strength with aging. This can result in bone fractures. If you are 56 years old or older, or if you are at risk for osteoporosis and fractures, ask your health care provider if you should:  Be screened for bone loss.  Take a calcium or vitamin D supplement to lower your risk of fractures.  Be given hormone replacement therapy (HRT) to treat symptoms of menopause. Follow these instructions at home: Lifestyle  Do not use any products that contain nicotine or tobacco, such as cigarettes, e-cigarettes, and chewing tobacco. If you need help quitting, ask your health care provider.  Do not use street drugs.  Do not share needles.  Ask your health care provider for help if you need support or information about quitting drugs. Alcohol use  Do not drink alcohol if: ? Your health care provider tells you not to drink. ? You are pregnant, may be pregnant, or are planning to become pregnant.  If you drink alcohol: ? Limit how much you use to 0-1 drink a day. ? Limit intake if you are breastfeeding.  Be aware of how much alcohol is in your drink. In the [place] one drink equals one 12 oz bottle of beer (355 mL), one 5 oz glass of wine (148 mL), or one 1 oz glass of hard liquor (44 mL). General instructions  Schedule regular health, dental, and eye exams.  Stay current with your vaccines.  Tell your health care provider if: ? You often feel depressed. ? You have ever been abused or do not feel safe at home. Summary  Adopting a healthy lifestyle and getting preventive care are important in promoting health and wellness.  Follow your health care provider's instructions about healthy  diet, exercising, and getting tested or screened for diseases.  Follow your health care provider's instructions on monitoring your cholesterol and blood pressure. This information is not intended to replace advice given to you by your health care provider. Make sure you discuss any questions you have with your health care provider. Document Released: 05/13/2011 Document Revised: 10/21/2018 Document Reviewed: 10/21/2018 Elsevier Patient Education  2020 [employer] Pelvic Pain, Female Pelvic pain is pain in your lower belly (abdomen), below your belly button and between your hips. The pain may start suddenly (be acute), keep coming back (be recurring), or last a long time (become chronic). Pelvic pain that lasts longer than 6 months is called chronic pelvic pain. There are many causes of pelvic pain. Sometimes the cause of pelvic pain is not known. Follow these instructions at home:   Take over-the-counter and prescription medicines only as told by your doctor.  Rest as told by your doctor.  Do not have sex if it hurts.  Keep a journal of  your pelvic pain. Write down: ? When the pain started. ? Where the pain is located. ? What seems to make the pain better or worse, such as food or your period (menstrual cycle). ? Any symptoms you have along with the pain.  Keep all follow-up visits as told by your doctor. This is important. Contact a doctor if:  Medicine does not help your pain.  Your pain comes back.  You have new symptoms.  You have unusual discharge or bleeding from your vagina.  You have a fever or chills.  You are having trouble pooping (constipation).  You have blood in your pee (urine) or poop (stool).  Your pee smells bad.  You feel weak or light-headed. Get help right away if:  You have sudden pain that is very bad.  Your pain keeps getting worse.  You have very bad pain and also have any of these symptoms: ? A fever. ? Feeling sick to your stomach  (nausea). ? Throwing up (vomiting). ? Being very sweaty.  You pass out (lose consciousness). Summary  Pelvic pain is pain in your lower belly (abdomen), below your belly button and between your hips.  There are many possible causes of pelvic pain.  Keep a journal of your pelvic pain. This information is not intended to replace advice given to you by your health care provider. Make sure you discuss any questions you have with your health care provider. Document Released: 04/15/2008 Document Revised: 04/15/2018 Document Reviewed: 04/15/2018 Elsevier Patient Education  2020 [employer].

## 2019-11-09 LAB — COMPREHENSIVE METABOLIC PANEL
ALT: 13 IU/L (ref 0–32)
AST: 18 IU/L (ref 0–40)
Albumin/Globulin Ratio: 1.5 (ref 1.2–2.2)
Albumin: 4.5 g/dL (ref 3.8–4.8)
Alkaline Phosphatase: 62 IU/L (ref 39–117)
BUN/Creatinine Ratio: 12 (ref 9–23)
BUN: 9 mg/dL (ref 6–20)
Bilirubin Total: 0.5 mg/dL (ref 0.0–1.2)
CO2: 20 mmol/L (ref 20–29)
Calcium: 9.5 mg/dL (ref 8.7–10.2)
Chloride: 104 mmol/L (ref 96–106)
Creatinine, Ser: 0.76 mg/dL (ref 0.57–1.00)
GFR calc Af Amer: 118 mL/min/{1.73_m2} (ref >59)
GFR calc non Af Amer: 103 mL/min/{1.73_m2} (ref >59)
Globulin, Total: 3.1 g/dL (ref 1.5–4.5)
Glucose: 82 mg/dL (ref 65–99)
Potassium: 4.2 mmol/L (ref 3.5–5.2)
Sodium: 137 mmol/L (ref 134–144)
Total Protein: 7.6 g/dL (ref 6.0–8.5)

## 2019-11-09 LAB — CBC WITH DIFFERENTIAL/PLATELET
Basophils Absolute: 0 10*3/uL (ref 0.0–0.2)
Basos: 0 %
EOS (ABSOLUTE): 0.1 10*3/uL (ref 0.0–0.4)
Eos: 1 %
Hematocrit: 42 % (ref 34.0–46.6)
Hemoglobin: 13.7 g/dL (ref 11.1–15.9)
Immature Grans (Abs): 0 10*3/uL (ref 0.0–0.1)
Immature Granulocytes: 0 %
Lymphocytes Absolute: 3 10*3/uL (ref 0.7–3.1)
Lymphs: 34 %
MCH: 28.4 pg (ref 26.6–33.0)
MCHC: 32.6 g/dL (ref 31.5–35.7)
MCV: 87 fL (ref 79–97)
Monocytes Absolute: 0.4 10*3/uL (ref 0.1–0.9)
Monocytes: 5 %
Neutrophils Absolute: 5.3 10*3/uL (ref 1.4–7.0)
Neutrophils: 60 %
Platelets: 255 10*3/uL (ref 150–450)
RBC: 4.83 x10E6/uL (ref 3.77–5.28)
RDW: 12.8 % (ref 11.7–15.4)
WBC: 8.9 10*3/uL (ref 3.4–10.8)

## 2019-11-09 LAB — LIPID PANEL
Chol/HDL Ratio: 2.7 ratio (ref 0.0–4.4)
Cholesterol, Total: 157 mg/dL (ref 100–199)
HDL: 59 mg/dL (ref >39)
LDL Chol Calc (NIH): 82 mg/dL (ref 0–99)
Triglycerides: 86 mg/dL (ref 0–149)
VLDL Cholesterol Cal: 16 mg/dL (ref 5–40)

## 2019-11-09 LAB — TSH: TSH: 2.01 u[IU]/mL (ref 0.450–4.500)

## 2019-11-09 NOTE — Progress Notes (Signed)
Results released with MyChart note

## 2019-12-31 ENCOUNTER — Encounter: Payer: Federal, State, Local not specified - PPO | Admitting: Certified Nurse Midwife

## 2020-01-10 ENCOUNTER — Telehealth: Payer: Self-pay

## 2020-01-10 NOTE — Telephone Encounter (Signed)
Copied from CRM [id number]. Topic: General - Other >> Jan 10, 2020 12:28 PM Jaquita Rector A wrote: Reason for CRM: Patient called to inform Marvell Fuller that she is still not feeling well since being diagnosed on 12/29/19 positive with covid. States that her breathing is still off, has headaches, taste and smell not back yet and need a few more days out of work. Would like a call back with some instructions please at Ph# [phone]

## 2020-01-10 NOTE — Telephone Encounter (Signed)
Spoke with patient on the phone she reports frequent headaches, shortness of breath on exertion and laying down and states that she has not gained back her taste or smell. I was going to offer patient a virtual appointment with you but you are booked tomorrow. Patient states that she has been taking tyelnol PRN with little to no relief, she denies cough or fever. Please advise. KW

## 2020-01-10 NOTE — Telephone Encounter (Signed)
Where was patient seen for diagnosis of Covid ?  She may need in person evaluation for this if symptoms are worsening.

## 2020-01-11 ENCOUNTER — Telehealth: Payer: Self-pay | Admitting: *Deleted

## 2020-01-11 NOTE — Telephone Encounter (Signed)
Attempted to notify pt of her pcp's message. No answer, left VM for pt to call back.

## 2020-01-11 NOTE — Telephone Encounter (Signed)
lmtcb okay for [facility] triage to advise patient of below response. KW

## 2020-01-13 NOTE — Telephone Encounter (Signed)
lmtcb to get update. KW

## 2020-01-17 DIAGNOSIS — B948 Sequelae of other specified infectious and parasitic diseases: Secondary | ICD-10-CM | POA: Diagnosis not present

## 2020-01-17 DIAGNOSIS — R5383 Other fatigue: Secondary | ICD-10-CM | POA: Diagnosis not present

## 2020-01-17 DIAGNOSIS — Z8616 Personal history of COVID-19: Secondary | ICD-10-CM | POA: Diagnosis not present

## 2020-01-20 NOTE — Telephone Encounter (Signed)
Yes per office protocol we have reached out three times and messages were left, patient should now return call.

## 2020-01-20 NOTE — Telephone Encounter (Signed)
I tried reaching out to patient again with no answer left another detailed message to get update. Since it has been 10days with no response is it okay to close encounter? KW

## 2020-01-28 ENCOUNTER — Ambulatory Visit (INDEPENDENT_AMBULATORY_CARE_PROVIDER_SITE_OTHER): Payer: Federal, State, Local not specified - PPO | Admitting: Physician Assistant

## 2020-01-28 DIAGNOSIS — F0781 Postconcussional syndrome: Secondary | ICD-10-CM

## 2020-01-28 MED ORDER — CYCLOBENZAPRINE HCL 10 MG PO TABS: 10.0000 mg | ORAL_TABLET | Freq: Every day | ORAL | 0 refills | Status: DC

## 2020-01-28 MED ORDER — MELOXICAM 7.5 MG PO TABS: 7.5000 mg | ORAL_TABLET | Freq: Every day | ORAL | 0 refills | Status: DC

## 2020-01-28 NOTE — Patient Instructions (Signed)
Post-Concussion Syndrome  Post-concussion syndrome is when symptoms last longer than normal after a head injury. What are the signs or symptoms? After a head injury, you may:  Have headaches.  Feel tired.  Feel dizzy.  Feel weak.  Have trouble seeing.  Have trouble in bright lights.  Have trouble hearing.  Not be able to remember things.  Not be able to focus.  Have trouble sleeping.  Have mood swings.  Have trouble learning new things. These can last from weeks to months. Follow these instructions at home: Medicines  Take all medicines only as told by your doctor.  Do not take prescription pain medicines. Activity  Limit activities as told by your doctor. This includes: ? Homework. ? Job-related work. ? Thinking. ? Watching TV. ? Using a computer or phone. ? Puzzles. ? Exercise. ? Sports.  Slowly return to your normal activity as told by your doctor.  Stop an activity if you have symptoms.  Do not do anything that may cause you to get injured again. General instructions  Rest. Try to: ? Sleep 7-9 hours each night. ? Take naps or breaks when you feel tired during the day.  Do not drink alcohol until your doctor says that you can.  Keep track of your symptoms.  Keep all follow-up visits as told by your doctor. This is important. Contact a doctor if:  You do not improve.  You get worse.  You have another injury. Get help right away if:  You have a very bad headache.  You feel confused.  You feel very sleepy.  You pass out (faint).  You throw up (vomit).  You feel weak in any part of your body.  You feel numb in any part of your body.  You start shaking (have a seizure).  You have trouble talking. Summary  Post-concussion syndrome is when symptoms last longer than normal after a head injury.  Limit all activity after your injury. Gradually return to normal activity as told by your doctor.  Rest, do not drink alcohol, and  avoid prescription pain medicines after a concussion.  Call your doctor if your symptoms get worse. This information is not intended to replace advice given to you by your health care provider. Make sure you discuss any questions you have with your health care provider. Document Revised: 08/20/2018 Document Reviewed: 12/02/2017 Elsevier Patient Education  2020 Elsevier Inc.  
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
## 2020-03-15 DIAGNOSIS — R519 Headache, unspecified: Secondary | ICD-10-CM | POA: Diagnosis not present

## 2020-03-15 DIAGNOSIS — Z01812 Encounter for preprocedural laboratory examination: Secondary | ICD-10-CM | POA: Diagnosis not present

## 2020-03-28 DIAGNOSIS — H9319 Tinnitus, unspecified ear: Secondary | ICD-10-CM | POA: Diagnosis not present

## 2020-04-03 DIAGNOSIS — J0140 Acute pansinusitis, unspecified: Secondary | ICD-10-CM | POA: Diagnosis not present

## 2020-05-31 IMAGING — CT CT RENAL STONE PROTOCOL
3 of 4 series · 8 of 46 positions shown, 15 images · non-contrast
Comparison: None.

CLINICAL DATA: Loss E a, vomiting and diarrhea since yesterday.
Hematuria.

EXAM:
CT ABDOMEN AND PELVIS WITHOUT CONTRAST
TECHNIQUE: Multidetector CT imaging of the abdomen and pelvis was performed
following the standard protocol without IV contrast.

[Series 4: lung bases · axial · 0.59mm/px · z∈[-128,-73]mm · 4 of 19 slices shown, 9 images]
[im 4/19  soft-tissue]
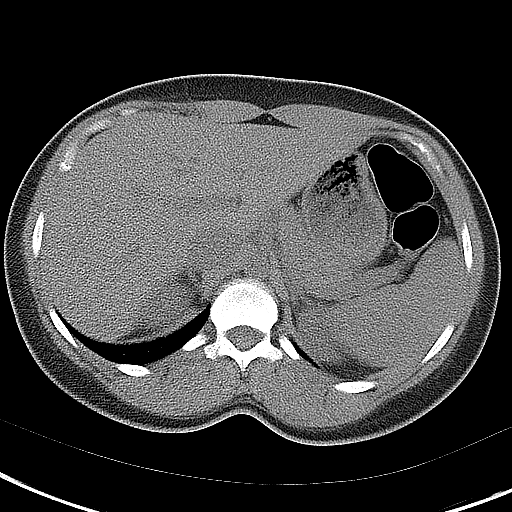
[im 4/19  lung]
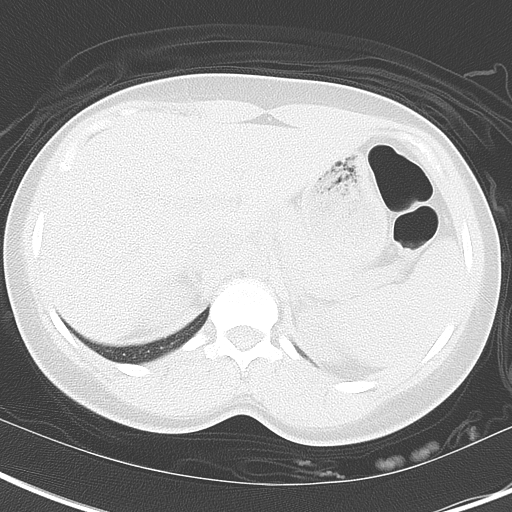
[im 4/19  bone]
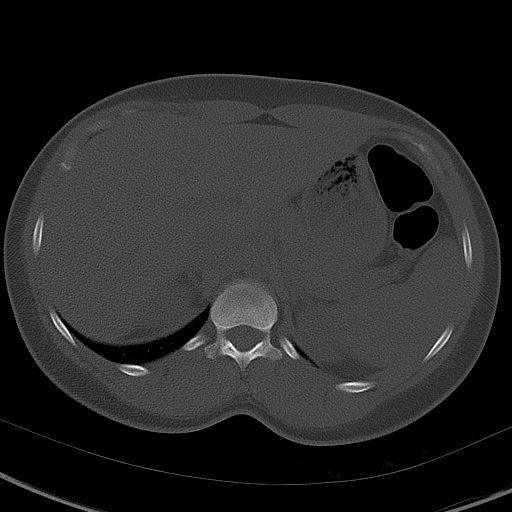
[im 8/19  soft-tissue]
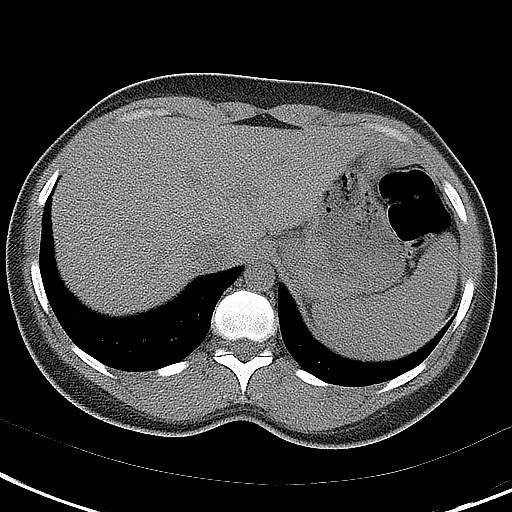
[im 8/19  lung]
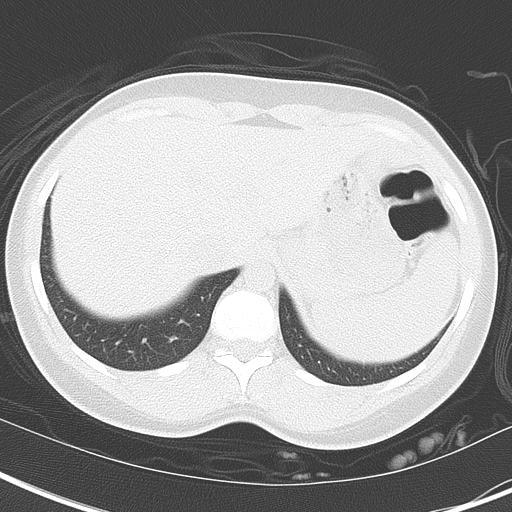
[im 11/19  soft-tissue]
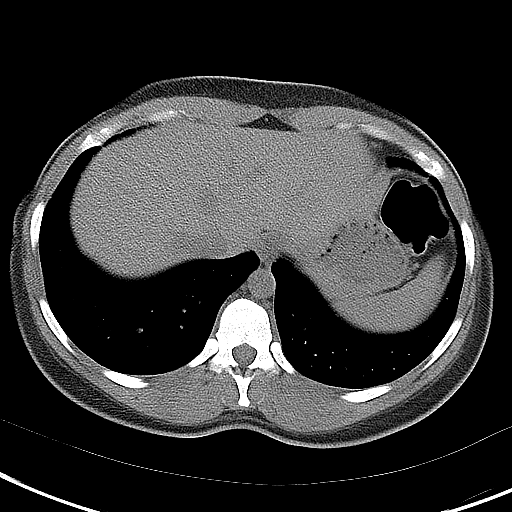
[im 11/19  lung]
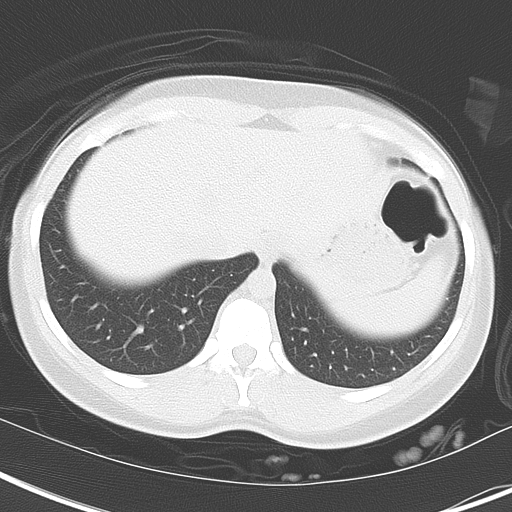
[im 15/19  soft-tissue]
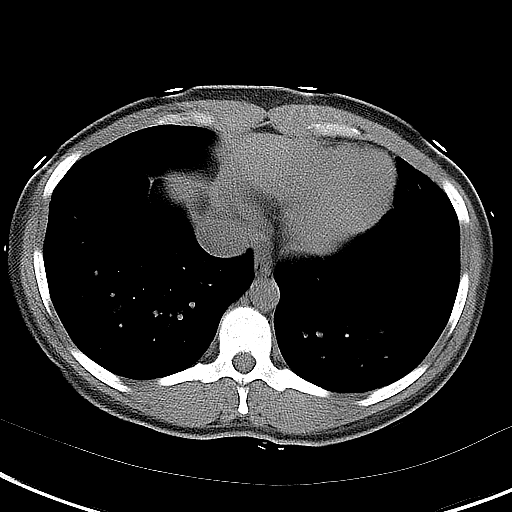
[im 15/19  lung]
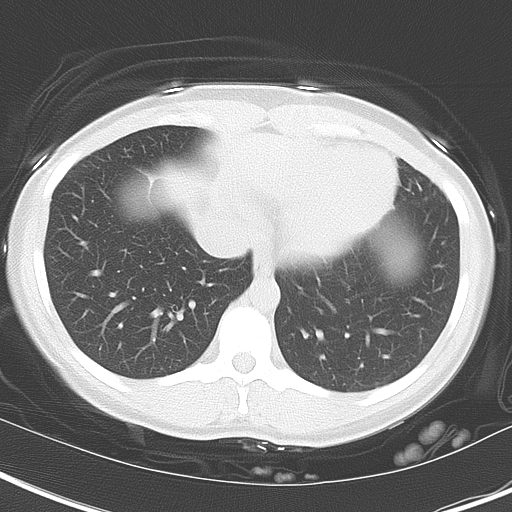

[Series 5: coronal · coronal · 0.65mm/px · 3 of 123 slices shown, 4 images]
[im 41/123  soft-tissue]
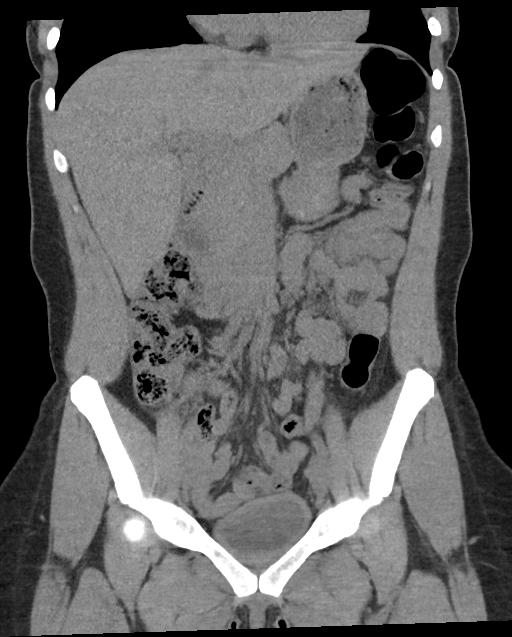
[im 55/123  soft-tissue]
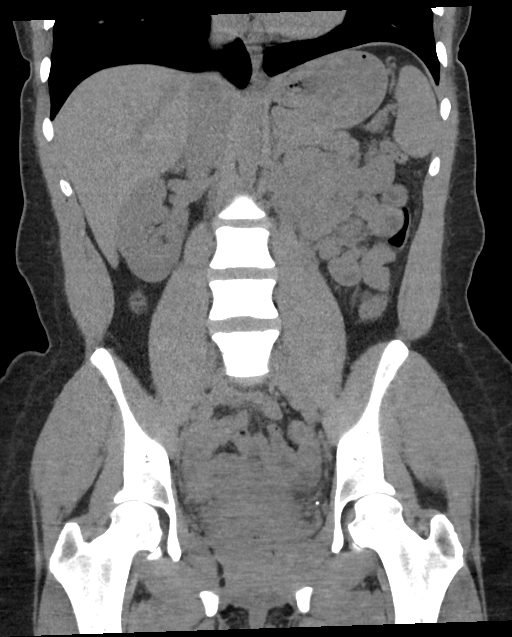
[im 55/123  bone]
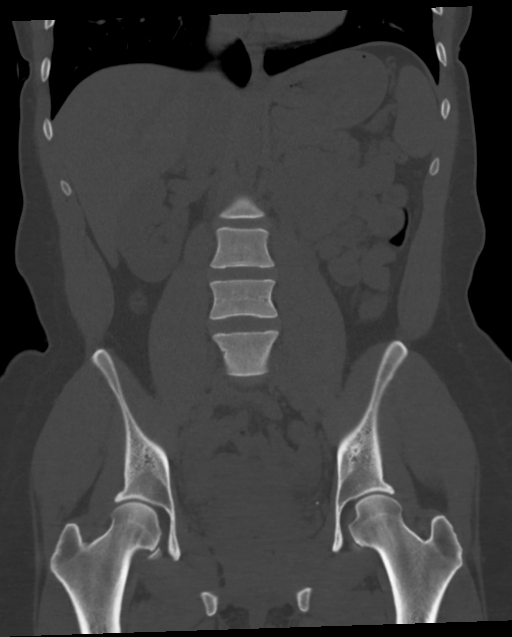
[im 68/123  soft-tissue]
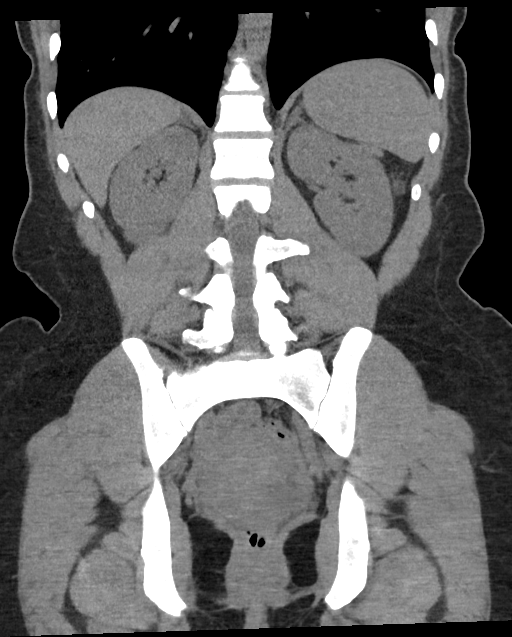

[Series 6: sagittal · sagittal · 0.48mm/px · 1 of 138 slices shown, 2 images]
[im 46/138  soft-tissue]
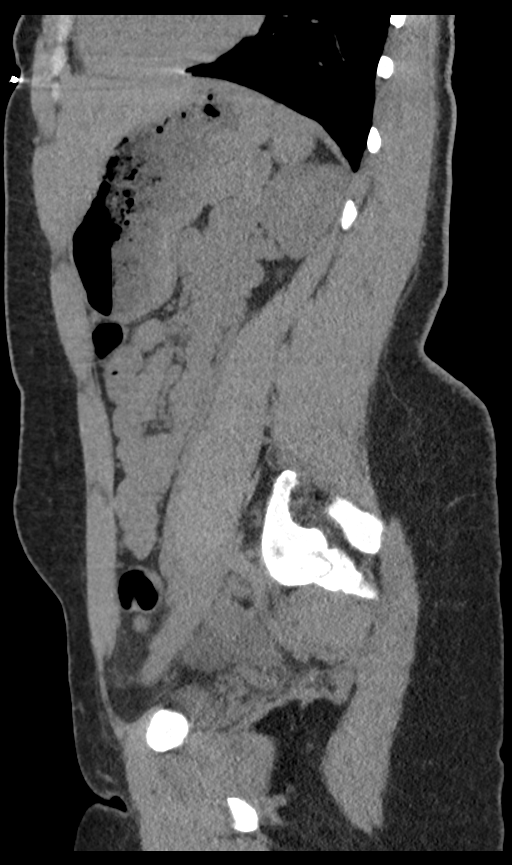
[im 46/138  bone]
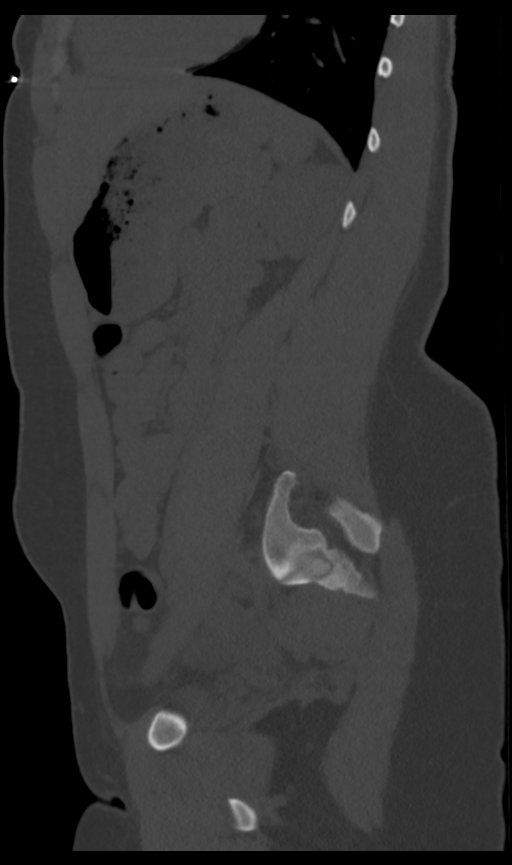

[8 of 46 positions shown; findings below may reference images not displayed]

FINDINGS: Lower chest: No acute abnormality.

Hepatobiliary: No focal liver abnormality is seen. No gallstones,
gallbladder wall thickening, or biliary dilatation.

Pancreas: Unremarkable. No pancreatic ductal dilatation or
surrounding inflammatory changes.

Spleen: Normal in size without focal abnormality.

Adrenals/Urinary Tract: Adrenal glands are unremarkable. Kidneys are
normal, without renal calculi, focal lesion, or hydronephrosis.
Bladder is unremarkable.

Stomach/Bowel: Stomach is within normal limits. Appendix appears
normal. No evidence of bowel wall thickening, distention, or
inflammatory changes.

Vascular/Lymphatic: No significant vascular findings are present. No
enlarged abdominal or pelvic lymph nodes.

Reproductive: The uterus is normal. There are cysts within the left
ovary.

Other: Small amount of free fluid is identified in the pelvis.
Pelvic phleboliths are identified.

Musculoskeletal: No acute or significant osseous findings.
IMPRESSION: No acute abnormality identified in the abdomen pelvis.

No bowel obstruction or diverticulitis.  The appendix is normal.

No hydronephrosis bilaterally.

Cysts within the left ovary, likely follicular. Small amount of free
fluid identified in the pelvis, likely physiologic and may be in
part due to recently ruptured follicular cyst.

## 2020-06-16 ENCOUNTER — Encounter: Payer: Self-pay | Admitting: Certified Nurse Midwife

## 2020-06-16 ENCOUNTER — Ambulatory Visit (INDEPENDENT_AMBULATORY_CARE_PROVIDER_SITE_OTHER): Payer: Federal, State, Local not specified - PPO | Admitting: Certified Nurse Midwife

## 2020-06-16 ENCOUNTER — Other Ambulatory Visit (HOSPITAL_COMMUNITY)
Admission: RE | Admit: 2020-06-16 | Discharge: 2020-06-16 | Disposition: A | Discharge status: Home / Self Care | Payer: Federal, State, Local not specified - PPO | Source: Ambulatory Visit | Attending: Certified Nurse Midwife | Admitting: Certified Nurse Midwife

## 2020-06-16 VITALS — BP 106/81 | HR 88 | Wt 164.8 lb

## 2020-06-16 DIAGNOSIS — Z113 Encounter for screening for infections with a predominantly sexual mode of transmission: Secondary | ICD-10-CM | POA: Diagnosis not present

## 2020-06-16 DIAGNOSIS — N898 Other specified noninflammatory disorders of vagina: Secondary | ICD-10-CM

## 2020-06-16 DIAGNOSIS — N6452 Nipple discharge: Secondary | ICD-10-CM | POA: Diagnosis not present

## 2020-06-16 DIAGNOSIS — Z01419 Encounter for gynecological examination (general) (routine) without abnormal findings: Secondary | ICD-10-CM

## 2020-06-16 DIAGNOSIS — R102 Pelvic and perineal pain unspecified side: Secondary | ICD-10-CM

## 2020-06-16 DIAGNOSIS — Z124 Encounter for screening for malignant neoplasm of cervix: Secondary | ICD-10-CM

## 2020-06-16 DIAGNOSIS — N920 Excessive and frequent menstruation with regular cycle: Secondary | ICD-10-CM

## 2020-06-16 NOTE — Patient Instructions (Signed)
Menorrhagia Menorrhagia is when your menstrual periods are heavy or last longer than normal. Follow these instructions at home: Medicines   Take over-the-counter and prescription medicines exactly as told by your doctor. This includes iron pills.  Do not change or switch medicines without asking your doctor.  Do not take aspirin or medicines that contain aspirin 1 week before or during your period. Aspirin may make bleeding worse. General instructions  If you need to change your pad or tampon more than once every 2 hours, limit your activity until the bleeding stops.  Iron pills can cause problems when pooping (constipation). To prevent or treat pooping problems while taking prescription iron pills, your doctor may suggest that you: ? Drink enough fluid to keep your pee (urine) clear or pale yellow. ? Take over-the-counter or prescription medicines. ? Eat foods that are high in fiber. These foods include:  Fresh fruits and vegetables.  Whole grains.  Beans. ? Limit foods that are high in fat and processed sugars. This includes fried and sweet foods.  Eat healthy meals and foods that are high in iron. Foods that have a lot of iron include: ? Leafy green vegetables. ? Meat. ? Liver. ? Eggs. ? Whole grain breads and cereals.  Do not try to lose weight until your heavy bleeding has stopped and you have normal amounts of iron in your blood. If you need to lose weight, work with your doctor.  Keep all follow-up visits as told by your doctor. This is important. Contact a doctor if:  You soak through a pad or tampon every 1 or 2 hours, and this happens every time you have a period.  You need to use pads and tampons at the same time because you are bleeding so much.  You are taking medicine and you: ? Feel sick to your stomach (nauseous). ? Throw up (vomit). ? Have watery poop (diarrhea).  You have other problems that may be related to the medicine you are taking. Get help  right away if:  You soak through more than a pad or tampon in 1 hour.  You pass clots bigger than 1 inch (2.5 cm) wide.  You feel short of breath.  You feel like your heart is beating too fast.  You feel dizzy or you pass out (faint).  You feel very weak or tired. Summary  Menorrhagia is when your menstrual periods are heavy or last longer than normal.  Take over-the-counter and prescription medicines exactly as told by your doctor. This includes iron pills.  Contact a doctor if you soak through more than a pad or tampon in 1 hour or are passing large clots. This information is not intended to replace advice given to you by your health care provider. Make sure you discuss any questions you have with your health care provider. Document Revised: 02/04/2018 Document Reviewed: 11/18/2016 Elsevier Patient Education  [place] Galactorrhea is the flow of a milky fluid (discharge) from the breast. It is different from normal milk in nursing mothers. The fluid can be white, yellow, or green. This condition can be caused by many things. Most cases are not serious and do not require treatment. Watch your condition to make sure it goes away. Follow these instructions at home: Breast care   Watch your condition for any changes.  Do not squeeze your breasts or nipples.  Avoid touching your breasts when you are having sex.  Perform a breast self-exam once a month.  Avoid  clothes that rub on your nipples.  Use breast pads to absorb the fluid.  Wear a support bra or a breast binder. General instructions  Take over-the-counter and prescription medicines only as told by your doctor.  Keep all follow-up visits as told by your doctor. This is important. Contact a doctor if you:  Have hot flashes.  Have vaginal dryness.  Have no desire for sex.  Stop having periods, or have periods that are irregular or far apart.  Have headaches.  Cannot see  well. Get help right away if:  Your breast discharge is bloody or yellowish white (puslike).  You have breast pain.  You feel a lump in your breast.  Your breast shows wrinkling or dimpling.  Your breast becomes red and swollen. Summary  Galactorrhea is the flow of a milky fluid (discharge) from the breast.  This condition may be caused by many things, but it is not usually serious.  Watch your condition carefully to make sure that it goes away.  Get help right away if the fluid is bloody or yellowish white, or if you have a lump, pain, or skin changes on your breast. This information is not intended to replace advice given to you by your health care provider. Make sure you discuss any questions you have with your health care provider. Document Revised: 11/10/2017 Document Reviewed: 11/10/2017 Elsevier Patient Education  [place].   Pelvic Pain, Female Pelvic pain is pain in your lower belly (abdomen), below your belly button and between your hips. The pain may start suddenly (be acute), keep coming back (be recurring), or last a long time (become chronic). Pelvic pain that lasts longer than 6 months is called chronic pelvic pain. There are many causes of pelvic pain. Sometimes the cause of pelvic pain is not known. Follow these instructions at home:   Take over-the-counter and prescription medicines only as told by your doctor.  Rest as told by your doctor.  Do not have sex if it hurts.  Keep a journal of your pelvic pain. Write down: ? When the pain started. ? Where the pain is located. ? What seems to make the pain better or worse, such as food or your period (menstrual cycle). ? Any symptoms you have along with the pain.  Keep all follow-up visits as told by your doctor. This is important. Contact a doctor if:  Medicine does not help your pain.  Your pain comes back.  You have new symptoms.  You have unusual discharge or bleeding from your  vagina.  You have a fever or chills.  You are having trouble pooping (constipation).  You have blood in your pee (urine) or poop (stool).  Your pee smells bad.  You feel weak or light-headed. Get help right away if:  You have sudden pain that is very bad.  Your pain keeps getting worse.  You have very bad pain and also have any of these symptoms: ? A fever. ? Feeling sick to your stomach (nausea). ? Throwing up (vomiting). ? Being very sweaty.  You pass out (lose consciousness). Summary  Pelvic pain is pain in your lower belly (abdomen), below your belly button and between your hips.  There are many possible causes of pelvic pain.  Keep a journal of your pelvic pain. This information is not intended to replace advice given to you by your health care provider. Make sure you discuss any questions you have with your health care provider. Document Revised: 04/15/2018 Document Reviewed:  04/15/2018 Elsevier Patient Education  2020 [place] 50-74 Years Old, Female Preventive care refers to visits with your health care provider and lifestyle choices that can promote health and wellness. This includes:  A yearly physical exam. This may also be called an annual well check.  Regular dental visits and eye exams.  Immunizations.  Screening for certain conditions.  Healthy lifestyle choices, such as eating a healthy diet, getting regular exercise, not using drugs or products that contain nicotine and tobacco, and limiting alcohol use. What can I expect for my preventive care visit? Physical exam Your health care provider will check your:  Height and weight. This may be used to calculate body mass index (BMI), which tells if you are at a healthy weight.  Heart rate and blood pressure.  Skin for abnormal spots. Counseling Your health care provider may ask you questions about your:  Alcohol, tobacco, and drug use.  Emotional well-being.  Home and  relationship well-being.  Sexual activity.  Eating habits.  Work and work Statistician.  Method of birth control.  Menstrual cycle.  Pregnancy history. What immunizations do I need?  Influenza (flu) vaccine  This is recommended every year. Tetanus, diphtheria, and pertussis (Tdap) vaccine  You may need a Td booster every 10 years. Varicella (chickenpox) vaccine  You may need this if you have not been vaccinated. Human papillomavirus (HPV) vaccine  If recommended by your health care provider, you may need three doses over 6 months. Measles, mumps, and rubella (MMR) vaccine  You may need at least one dose of MMR. You may also need a second dose. Meningococcal conjugate (MenACWY) vaccine  One dose is recommended if you are age 3-21 years and a first-year college student living in a residence hall, or if you have one of several medical conditions. You may also need additional booster doses. Pneumococcal conjugate (PCV13) vaccine  You may need this if you have certain conditions and were not previously vaccinated. Pneumococcal polysaccharide (PPSV23) vaccine  You may need one or two doses if you smoke cigarettes or if you have certain conditions. Hepatitis A vaccine  You may need this if you have certain conditions or if you travel or work in places where you may be exposed to hepatitis A. Hepatitis B vaccine  You may need this if you have certain conditions or if you travel or work in places where you may be exposed to hepatitis B. Haemophilus influenzae type b (Hib) vaccine  You may need this if you have certain conditions. You may receive vaccines as individual doses or as more than one vaccine together in one shot (combination vaccines). Talk with your health care provider about the risks and benefits of combination vaccines. What tests do I need?  Blood tests  Lipid and cholesterol levels. These may be checked every 5 years starting at age 67.  Hepatitis C  test.  Hepatitis B test. Screening  Diabetes screening. This is done by checking your blood sugar (glucose) after you have not eaten for a while (fasting).  Sexually transmitted disease (STD) testing.  BRCA-related cancer screening. This may be done if you have a family history of breast, ovarian, tubal, or peritoneal cancers.  Pelvic exam and Pap test. This may be done every 3 years starting at age 58. Starting at age 82, this may be done every 5 years if you have a Pap test in combination with an HPV test. Talk with your health care provider about your test  results, treatment options, and if necessary, the need for more tests. Follow these instructions at home: Eating and drinking   Eat a diet that includes fresh fruits and vegetables, whole grains, lean protein, and low-fat dairy.  Take vitamin and mineral supplements as recommended by your health care provider.  Do not drink alcohol if: ? Your health care provider tells you not to drink. ? You are pregnant, may be pregnant, or are planning to become pregnant.  If you drink alcohol: ? Limit how much you have to 0-1 drink a day. ? Be aware of how much alcohol is in your drink. In the [place] one drink equals one 12 oz bottle of beer (355 mL), one 5 oz glass of wine (148 mL), or one 1 oz glass of hard liquor (44 mL). Lifestyle  Take daily care of your teeth and gums.  Stay active. Exercise for at least 30 minutes on 5 or more days each week.  Do not use any products that contain nicotine or tobacco, such as cigarettes, e-cigarettes, and chewing tobacco. If you need help quitting, ask your health care provider.  If you are sexually active, practice safe sex. Use a condom or other form of birth control (contraception) in order to prevent pregnancy and STIs (sexually transmitted infections). If you plan to become pregnant, see your health care provider for a preconception visit. What's next?  Visit your health care provider once a  year for a well check visit.  Ask your health care provider how often you should have your eyes and teeth checked.  Stay up to date on all vaccines. This information is not intended to replace advice given to you by your health care provider. Make sure you discuss any questions you have with your health care provider. Document Revised: 07/09/2018 Document Reviewed: 07/09/2018 Elsevier Patient Education  2020 [employer].

## 2020-06-16 NOTE — Progress Notes (Signed)
ANNUAL PREVENTATIVE CARE GYN  ENCOUNTER NOTE  Subjective:       Kaitlyn Fry is a 34 y.o. G3P2 female here for a routine annual gynecologic exam.  Current complaints: 1. Heavy periods and pelvic pain since tubal ligation in 2012 2. Needs Pap smear 3. Requests STI testing 4. Nipple discharge   Denies difficulty breathing or respiratory distress, chest pain, dysuria, and leg pain or swelling.    Gynecologic History  Patient's last menstrual period was 05/27/2020 (exact date). Period Cycle (Days): 28 (random start cycle) Period Pattern: (!) Irregular Menstrual Flow: Light, Moderate, Heavy Menstrual Control: Maxi pad Menstrual Control Change Freq (Hours): 2/3 Dysmenorrhea: (!) Severe Dysmenorrhea Symptoms: Headache, Nausea  Contraception: tubal ligation  Last Pap: 2017. Results were: normal   Obstetric History  OB History  Gravida Para Term Preterm AB Living  3 2 2  0 1 2  SAB TAB Ectopic Multiple Live Births               # Outcome Date GA Lbr Len/2nd Weight Sex Delivery Anes PTL Lv  3 AB           2 Term           1 Term             Past Medical History:  Diagnosis Date  . Anxiety   . Asthma   . Depression     Past Surgical History:  Procedure Laterality Date  . TUBAL LIGATION      Current Outpatient Medications on File Prior to Visit  Medication Sig Dispense Refill  . cyclobenzaprine (FLEXERIL) 10 MG tablet Take 1 tablet (10 mg total) by mouth at bedtime. 30 tablet 0  . meloxicam (MOBIC) 7.5 MG tablet Take 1 tablet (7.5 mg total) by mouth daily. 30 tablet 0   No current facility-administered medications on file prior to visit.    No Known Allergies  Social History   Socioeconomic History  . Marital status: Married    Spouse name: Not on file  . Number of children: Not on file  . Years of education: Not on file  . Highest education level: Not on file  Occupational History  . Not on file  Tobacco Use  . Smoking status: Current Some Day Smoker     Packs/day: 0.25    Types: Cigars  . Smokeless tobacco: Never Used  Vaping Use  . Vaping Use: Never used  Substance and Sexual Activity  . Alcohol use: Yes    Comment: occasionally   . Drug use: No  . Sexual activity: Yes  Other Topics Concern  . Not on file  Social History Narrative  . Not on file   Social Determinants of Health   Financial Resource Strain:   . Difficulty of Paying Living Expenses:   Food Insecurity:   . Worried About in the Last Year:   . Programme researcher, broadcasting/film/video in the Last Year:   Transportation Needs:   . Barista (Medical):   Freight forwarder Lack of Transportation (Non-Medical):   Physical Activity:   . Days of Exercise per Week:   . Minutes of Exercise per Session:   Stress:   . Feeling of Stress :   Social Connections:   . Frequency of Communication with Friends and Family:   . Frequency of Social Gatherings with Friends and Family:   . Attends Religious Services:   . Active Member of Clubs or Organizations:   . Attends  Club or Organization Meetings:   Marland Kitchen Marital Status:   Intimate Partner Violence:   . Fear of Current or Ex-Partner:   . Emotionally Abused:   Marland Kitchen Physically Abused:   . Sexually Abused:     History reviewed. No pertinent family history.  The following portions of the patient's history were reviewed and updated as appropriate: allergies, current medications, past family history, past medical history, past social history, past surgical history and problem list.  Review of Systems  ROS negative except as noted above. Information obtained from patient.    Objective:   BP 106/81   Pulse 88   Wt 164 lb 12.8 oz (74.8 kg)   LMP 05/27/2020 (Exact Date)   BMI 32.19 kg/m   CONSTITUTIONAL: Well-developed, well-nourished female in no acute distress.   PSYCHIATRIC: Normal mood and affect. Normal behavior. Normal judgment and thought content.  NEUROLGIC: Alert and oriented to person, place, and time. Normal muscle  tone coordination. No cranial nerve deficit noted.  HENT:  Normocephalic, atraumatic, External right and left ear normal.   EYES: Conjunctivae and EOM are normal. Pupils are equal and round.   NECK: Normal range of motion, supple, no masses.  Normal thyroid.   SKIN: Skin is warm and dry. No rash noted. Not diaphoretic. No erythema. No pallor. Professional tattoos present.   CARDIOVASCULAR: Normal heart rate noted,  regular rhythm, no murmur.  RESPIRATORY: Clear to auscultation bilaterally. Effort and breath sounds normal, no problems with respiration noted.  BREASTS: Symmetric in size. No masses, skin changes, nipple drainage, or lymphadenopathy.  ABDOMEN: Soft, normal bowel sounds, no distention noted.  No tenderness, rebound or guarding.   PELVIC:  External Genitalia: Normal  Vagina: Normal  Cervix: Normal  Uterus: Normal  Adnexa: Normal  MUSCULOSKELETAL: Normal range of motion. No tenderness.  No cyanosis, clubbing, or edema.  2+ distal pulses.  LYMPHATIC: No Axillary, Supraclavicular, or Inguinal Adenopathy.  Assessment:   Annual gynecologic examination 34 y.o.   Contraception: tubal ligation   Obesity 1   Problem List Items Addressed This Visit      Other   Pelvic pain in female   Relevant Orders   Cervicovaginal ancillary only   US PELVIS TRANSVAGINAL NON-OB (TV ONLY)   US PELVIS (TRANSABDOMINAL ONLY)   Menorrhagia with regular cycle   Relevant Orders   CBC   Estradiol   FSH/LH   Thyroid Panel With TSH   US PELVIS TRANSVAGINAL NON-OB (TV ONLY)   US PELVIS (TRANSABDOMINAL ONLY)    Other Visit Diagnoses    Well woman exam    -  Primary   Relevant Orders   CBC   Estradiol   FSH/LH   Thyroid Panel With TSH   Prolactin   Cytology - PAP   Cervicovaginal ancillary only   US PELVIS TRANSVAGINAL NON-OB (TV ONLY)   US PELVIS (TRANSABDOMINAL ONLY)   Screening for cervical cancer       Relevant Orders   Cytology - PAP   Vaginal discharge       Relevant  Orders   Cervicovaginal ancillary only   Nipple discharge       Relevant Orders   Prolactin   Routine screening for STI (sexually transmitted infection)       Relevant Orders   Cytology - PAP   Cervicovaginal ancillary only      Plan:   Pap: Pap Co Test and GC/CT NAAT  Labs: See orders  Routine preventative health maintenance measures emphasized: Exercise/Diet/Weight control, Tobacco  Warnings, Alcohol/Substance use risks, Stress Management and Safe Sex; see AVS  Reviewed red flag symptoms and when to call  RTC for ultrasound and results review  Return to Clinic - 1 Year for [facility] or sooner if needed   Serafina Royals, CNM  [facility], [facility] 06/16/20 4:09 PM

## 2020-06-16 NOTE — Progress Notes (Signed)
Pt present for annual exam.  Pt last pap: unsure. Pt would like to discuss her irregular cycle.

## 2020-06-20 ENCOUNTER — Telehealth: Payer: Self-pay | Admitting: Certified Nurse Midwife

## 2020-06-20 ENCOUNTER — Other Ambulatory Visit: Payer: Self-pay | Admitting: Certified Nurse Midwife

## 2020-06-20 DIAGNOSIS — A5901 Trichomonal vulvovaginitis: Secondary | ICD-10-CM

## 2020-06-20 DIAGNOSIS — B9689 Other specified bacterial agents as the cause of diseases classified elsewhere: Secondary | ICD-10-CM

## 2020-06-20 LAB — CERVICOVAGINAL ANCILLARY ONLY
Bacterial Vaginitis (gardnerella): POSITIVE — AB
Candida Glabrata: NEGATIVE
Candida Vaginitis: NEGATIVE
Chlamydia: NEGATIVE
Comment: NEGATIVE
Comment: NEGATIVE
Comment: NEGATIVE
Comment: NEGATIVE
Comment: NEGATIVE
Comment: NORMAL
Neisseria Gonorrhea: NEGATIVE
Trichomonas: POSITIVE — AB

## 2020-06-20 MED ORDER — METRONIDAZOLE 500 MG PO TABS: 500.0000 mg | ORAL_TABLET | Freq: Two times a day (BID) | ORAL | 0 refills | Status: AC

## 2020-06-20 NOTE — Progress Notes (Signed)
Rx Flagyl, see orders.    Serafina Royals, CNM [facility], [facility] 06/20/20 10:29 AM

## 2020-06-20 NOTE — Telephone Encounter (Signed)
Patient called in stating that the prescription that was just sent to her pharmacy she can't take because it makes her sick. Could you please advise?

## 2020-06-22 LAB — CYTOLOGY - PAP
Comment: NEGATIVE
Comment: NEGATIVE
Diagnosis: NEGATIVE
Diagnosis: REACTIVE
HSV1: NEGATIVE
HSV2: NEGATIVE
High risk HPV: NEGATIVE

## 2020-06-22 MED ORDER — TINIDAZOLE 500 MG PO TABS: 2.0000 g | ORAL_TABLET | Freq: Every day | ORAL | 2 refills | Status: AC

## 2020-06-22 NOTE — Telephone Encounter (Signed)
1114 Telephone call to patient, verified full name of birth.   Patient states that she is unable to take medication, because the pill "makes her sick".   When asked to elaborate on symptoms patient unable to distinguish between allergic reaction and antabuse reactions related to Flagyl.   Patient requests medications that "starts with T" that she googled. Questioned if medication came in capsule form.   Advised the Tinidazole is similar to Flagyl and comes in pill form.   Patient became belligerent and stated she would just contact her PCP for her desired medication.    Advised patient that her desired prescription would be send to her pharmacy on file and that she could follow up with a test of cure in a month her or wherever she desired.   Call ended.    Kaitlyn Fry, CNM [facility], [facility] 06/22/20 11:26 AM

## 2020-06-22 NOTE — Telephone Encounter (Signed)
Patient called in again, she states the someone replied back to her MyChart message and was confused as she didn't know who it was. Informed her that her provider's nurse responded back to her informed her that her provided was out of the office. Patient is requesting a different prescription sent in to the [facility] on [address]. Patient states the original prescription makes the patient sick.

## 2020-06-28 ENCOUNTER — Other Ambulatory Visit: Payer: Self-pay | Admitting: Adult Health

## 2020-06-28 NOTE — Telephone Encounter (Signed)
Medication Refill - Medication: Tinidazole  Has the patient contacted their pharmacy? No.  (Agent: If no, request that the patient contact the pharmacy for the refill.) (Agent: If yes, when and what did the pharmacy advise?)  Preferred Pharmacy (with phone number or street name): [facility]  Agent: Please be advised that RX refills may take up to 3 business days. We ask that you follow-up with your pharmacy.  Patient states she is not able to contact GYN for personal reasons.

## 2020-06-28 NOTE — Telephone Encounter (Signed)
Requested medication (s) are due for refill today: ordered on 06/22/20  Requested medication (s) are on the active medication list: yes  Last refill:  06/22/20 #8 2 refills  Future visit scheduled: yes with OBGYN on 07/13/20  Notes to clinic:  no protocol assigned, please advise for requested prescription     Requested Prescriptions  Pending Prescriptions Disp Refills   tinidazole (TINDAMAX) 500 MG tablet 8 tablet 2    Sig: Take 4 tablets (2,000 mg total) by mouth daily with breakfast. For two days      Off-Protocol Failed - 06/28/2020 10:24 AM      Failed - Medication not assigned to a protocol, review manually.      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

## 2020-07-13 ENCOUNTER — Other Ambulatory Visit: Payer: Federal, State, Local not specified - PPO

## 2020-07-13 ENCOUNTER — Encounter: Payer: Federal, State, Local not specified - PPO | Admitting: Certified Nurse Midwife

## 2020-09-19 ENCOUNTER — Telehealth: Payer: Self-pay

## 2020-09-19 DIAGNOSIS — M62838 Other muscle spasm: Secondary | ICD-10-CM | POA: Diagnosis not present

## 2020-09-19 DIAGNOSIS — S39012A Strain of muscle, fascia and tendon of lower back, initial encounter: Secondary | ICD-10-CM | POA: Diagnosis not present

## 2020-09-19 NOTE — Telephone Encounter (Signed)
Kaitlyn Fry, are we able to do this? Please advise. Thanks!

## 2020-09-19 NOTE — Telephone Encounter (Signed)
Copied from CRM [id number]. Topic: General - Other >> Sep 19, 2020  1:24 PM Dalphine Handing A wrote: Patient is currently at urgent care and would like te office to fax over a copy of her insurance card to [phone]. Please advise

## 2020-11-09 DIAGNOSIS — Z23 Encounter for immunization: Secondary | ICD-10-CM | POA: Diagnosis not present

## 2020-11-14 ENCOUNTER — Encounter: Payer: Self-pay | Admitting: Physician Assistant

## 2020-11-14 ENCOUNTER — Other Ambulatory Visit: Payer: Self-pay

## 2020-11-14 ENCOUNTER — Ambulatory Visit: Payer: Federal, State, Local not specified - PPO | Admitting: Physician Assistant

## 2020-11-14 VITALS — BP 120/80 | HR 73 | Resp 16 | Ht 60.0 in | Wt 166.0 lb

## 2020-11-14 DIAGNOSIS — M79601 Pain in right arm: Secondary | ICD-10-CM

## 2020-11-14 MED ORDER — FAMOTIDINE 20 MG PO TABS: 20.0000 mg | ORAL_TABLET | Freq: Two times a day (BID) | ORAL | 0 refills | Status: AC

## 2020-11-14 MED ORDER — MELOXICAM 7.5 MG PO TABS: 7.5000 mg | ORAL_TABLET | Freq: Every day | ORAL | 0 refills | Status: DC

## 2020-11-14 NOTE — Progress Notes (Signed)
      Established patient visit   Patient: Kaitlyn Fry   DOB: 1986/07/20   35 y.o. Female  MRN: [id number] Visit Date: 11/14/2020  Today's healthcare provider: Trey Sailors, PA-C   Chief Complaint  Patient presents with  . Shoulder Pain   Subjective    Shoulder Pain  This is a new problem. The current episode started in the past 7 days. There has been no history of extremity trauma. The problem occurs constantly. Associated symptoms include numbness. She has tried acetaminophen for the symptoms. The treatment provided no relief.    Patient reports that the pain is located in her right shoulder. She has numbness and tingling in her right hand.      Medications: Outpatient Medications Prior to Visit  Medication Sig  . cyclobenzaprine (FLEXERIL) 10 MG tablet Take 1 tablet (10 mg total) by mouth at bedtime.  Marland Kitchen tinidazole (TINDAMAX) 500 MG tablet Take 4 tablets (2,000 mg total) by mouth daily with breakfast. For two days  . [DISCONTINUED] meloxicam (MOBIC) 7.5 MG tablet Take 1 tablet (7.5 mg total) by mouth daily.   No facility-administered medications prior to visit.    Review of Systems  Constitutional: Negative.   Cardiovascular: Negative.   Musculoskeletal: Positive for arthralgias and myalgias. Negative for neck pain and neck stiffness.  Neurological: Positive for numbness.      Objective    BP 120/80   Pulse 73   Resp 16   Ht 5' (1.524 m)   Wt 166 lb (75.3 kg)   BMI 32.42 kg/m    Physical Exam Constitutional:      Appearance: Normal appearance.  Cardiovascular:     Rate and Rhythm: Normal rate.  Pulmonary:     Effort: Pulmonary effort is normal.  Musculoskeletal:     Right upper arm: Tenderness present.     Comments: Some tenderness of right lateral epicondyle.   Skin:    General: Skin is warm and dry.  Neurological:     Mental Status: She is alert and oriented to person, place, and time. Mental status is at baseline.       No results found  for any visits on 11/14/20.  Assessment & Plan    1. Right arm pain  Likely tendonitis, treat as below. Patient wants to know specifically what is wrong with her arm, will have her see specialist.   - meloxicam (MOBIC) 7.5 MG tablet; Take 1 tablet (7.5 mg total) by mouth daily.  Dispense: 30 tablet; Refill: 0 - famotidine (PEPCID) 20 MG tablet; Take 1 tablet (20 mg total) by mouth 2 (two) times daily.  Dispense: 60 tablet; Refill: 0 - Ambulatory referral to Orthopedics    No follow-ups on file.      ITrey Sailors, PA-C, have reviewed all documentation for this visit. The documentation on 11/22/20 for the exam, diagnosis, procedures, and orders are all accurate and complete.  The entirety of the information documented in the History of Present Illness, Review of Systems and Physical Exam were personally obtained by me. Portions of this information were initially documented by [facility] and reviewed by me for thoroughness and accuracy.     Maryella Shivers  [facility] [phone] (phone) [phone] (fax)  [facility]

## 2020-11-15 DIAGNOSIS — M7541 Impingement syndrome of right shoulder: Secondary | ICD-10-CM | POA: Diagnosis not present

## 2021-04-24 DIAGNOSIS — G43909 Migraine, unspecified, not intractable, without status migrainosus: Secondary | ICD-10-CM | POA: Diagnosis not present

## 2021-05-11 DIAGNOSIS — Z1152 Encounter for screening for COVID-19: Secondary | ICD-10-CM | POA: Diagnosis not present

## 2021-05-25 DIAGNOSIS — K080 Exfoliation of teeth due to systemic causes: Secondary | ICD-10-CM | POA: Diagnosis not present

## 2021-06-22 ENCOUNTER — Encounter: Payer: Federal, State, Local not specified - PPO | Admitting: Certified Nurse Midwife

## 2021-07-05 DIAGNOSIS — Z20822 Contact with and (suspected) exposure to covid-19: Secondary | ICD-10-CM | POA: Diagnosis not present

## 2021-07-09 DIAGNOSIS — N921 Excessive and frequent menstruation with irregular cycle: Secondary | ICD-10-CM | POA: Diagnosis not present

## 2021-07-09 DIAGNOSIS — J45909 Unspecified asthma, uncomplicated: Secondary | ICD-10-CM | POA: Diagnosis not present

## 2021-07-09 DIAGNOSIS — F419 Anxiety disorder, unspecified: Secondary | ICD-10-CM | POA: Diagnosis not present

## 2021-07-09 DIAGNOSIS — F4310 Post-traumatic stress disorder, unspecified: Secondary | ICD-10-CM | POA: Diagnosis not present

## 2021-07-10 DIAGNOSIS — Z113 Encounter for screening for infections with a predominantly sexual mode of transmission: Secondary | ICD-10-CM | POA: Diagnosis not present

## 2021-08-16 DIAGNOSIS — Z124 Encounter for screening for malignant neoplasm of cervix: Secondary | ICD-10-CM | POA: Diagnosis not present

## 2021-08-16 DIAGNOSIS — G43909 Migraine, unspecified, not intractable, without status migrainosus: Secondary | ICD-10-CM | POA: Diagnosis not present

## 2021-08-16 DIAGNOSIS — N921 Excessive and frequent menstruation with irregular cycle: Secondary | ICD-10-CM | POA: Diagnosis not present

## 2021-08-16 DIAGNOSIS — F4310 Post-traumatic stress disorder, unspecified: Secondary | ICD-10-CM | POA: Diagnosis not present

## 2021-08-16 DIAGNOSIS — N920 Excessive and frequent menstruation with regular cycle: Secondary | ICD-10-CM | POA: Diagnosis not present

## 2021-08-24 DIAGNOSIS — N926 Irregular menstruation, unspecified: Secondary | ICD-10-CM | POA: Diagnosis not present

## 2021-08-24 DIAGNOSIS — R102 Pelvic and perineal pain: Secondary | ICD-10-CM | POA: Diagnosis not present

## 2021-11-29 DIAGNOSIS — N760 Acute vaginitis: Secondary | ICD-10-CM | POA: Diagnosis not present

## 2021-11-29 DIAGNOSIS — N939 Abnormal uterine and vaginal bleeding, unspecified: Secondary | ICD-10-CM | POA: Diagnosis not present

## 2021-11-29 DIAGNOSIS — R0683 Snoring: Secondary | ICD-10-CM | POA: Diagnosis not present

## 2021-11-29 DIAGNOSIS — F4310 Post-traumatic stress disorder, unspecified: Secondary | ICD-10-CM | POA: Diagnosis not present

## 2021-12-17 ENCOUNTER — Emergency Department
Admission: EM | Admit: 2021-12-17 | Discharge: 2021-12-17 | Disposition: A | Discharge status: Home / Self Care | Payer: No Typology Code available for payment source | Attending: Emergency Medicine | Admitting: Emergency Medicine

## 2021-12-17 ENCOUNTER — Encounter: Payer: Self-pay | Admitting: Emergency Medicine

## 2021-12-17 ENCOUNTER — Emergency Department: Payer: No Typology Code available for payment source

## 2021-12-17 ENCOUNTER — Other Ambulatory Visit: Payer: Self-pay

## 2021-12-17 DIAGNOSIS — W06XXXA Fall from bed, initial encounter: Secondary | ICD-10-CM | POA: Diagnosis not present

## 2021-12-17 DIAGNOSIS — M545 Low back pain, unspecified: Secondary | ICD-10-CM

## 2021-12-17 MED ORDER — PREDNISONE 10 MG PO TABS: 10.0000 mg | ORAL_TABLET | Freq: Every day | ORAL | 0 refills | Status: AC

## 2021-12-17 MED ORDER — HYDROCODONE-ACETAMINOPHEN 5-325 MG PO TABS: 1.0000 | ORAL_TABLET | ORAL | 0 refills | Status: AC | PRN

## 2021-12-17 NOTE — ED Notes (Signed)
See triage  note  states she fell backwards about 1 week ago  states pain is mainly to lower back and now is moving up her back  ambulates well to treatment room
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
## 2021-12-17 NOTE — ED Triage Notes (Signed)
Pt via POV from home. Pt c/o lower back pain, pt said she had a fall a week ago and her back has been bothering her since then. Pt is A&OX4 and NAD.

## 2022-02-27 DIAGNOSIS — N939 Abnormal uterine and vaginal bleeding, unspecified: Secondary | ICD-10-CM | POA: Diagnosis not present

## 2022-03-29 DIAGNOSIS — Z1152 Encounter for screening for COVID-19: Secondary | ICD-10-CM | POA: Diagnosis not present

## 2022-03-29 DIAGNOSIS — Z0189 Encounter for other specified special examinations: Secondary | ICD-10-CM | POA: Diagnosis not present

## 2022-07-12 ENCOUNTER — Other Ambulatory Visit: Payer: Self-pay

## 2022-07-12 DIAGNOSIS — J45909 Unspecified asthma, uncomplicated: Secondary | ICD-10-CM | POA: Insufficient documentation

## 2022-07-12 DIAGNOSIS — M436 Torticollis: Secondary | ICD-10-CM | POA: Diagnosis not present

## 2022-07-12 DIAGNOSIS — M542 Cervicalgia: Secondary | ICD-10-CM | POA: Diagnosis present

## 2022-07-12 LAB — CBC WITH DIFFERENTIAL/PLATELET
Abs Immature Granulocytes: 0.03 10*3/uL (ref 0.00–0.07)
Basophils Absolute: 0 10*3/uL (ref 0.0–0.1)
Basophils Relative: 0 %
Eosinophils Absolute: 0.1 10*3/uL (ref 0.0–0.5)
Eosinophils Relative: 1 %
HCT: 42 % (ref 36.0–46.0)
Hemoglobin: 13.3 g/dL (ref 12.0–15.0)
Immature Granulocytes: 0 %
Lymphocytes Relative: 38 %
Lymphs Abs: 3.9 10*3/uL (ref 0.7–4.0)
MCH: 27.4 pg (ref 26.0–34.0)
MCHC: 31.7 g/dL (ref 30.0–36.0)
MCV: 86.4 fL (ref 80.0–100.0)
Monocytes Absolute: 0.4 10*3/uL (ref 0.1–1.0)
Monocytes Relative: 4 %
Neutro Abs: 5.7 10*3/uL (ref 1.7–7.7)
Neutrophils Relative %: 57 %
Platelets: 271 10*3/uL (ref 150–400)
RBC: 4.86 MIL/uL (ref 3.87–5.11)
RDW: 13.9 % (ref 11.5–15.5)
WBC: 10.3 10*3/uL (ref 4.0–10.5)
nRBC: 0 % (ref 0.0–0.2)

## 2022-07-12 LAB — URINALYSIS, ROUTINE W REFLEX MICROSCOPIC
Bacteria, UA: NONE SEEN
Bilirubin Urine: NEGATIVE
Glucose, UA: NEGATIVE mg/dL
Hgb urine dipstick: NEGATIVE
Ketones, ur: 5 mg/dL — AB
Nitrite: NEGATIVE
Protein, ur: NEGATIVE mg/dL
Specific Gravity, Urine: 1.028 (ref 1.005–1.030)
pH: 5 (ref 5.0–8.0)

## 2022-07-12 LAB — COMPREHENSIVE METABOLIC PANEL
ALT: 11 U/L (ref 0–44)
AST: 18 U/L (ref 15–41)
Albumin: 4.3 g/dL (ref 3.5–5.0)
Alkaline Phosphatase: 54 U/L (ref 38–126)
Anion gap: 6 (ref 5–15)
BUN: 12 mg/dL (ref 6–20)
CO2: 24 mmol/L (ref 22–32)
Calcium: 9.1 mg/dL (ref 8.9–10.3)
Chloride: 108 mmol/L (ref 98–111)
Creatinine, Ser: 0.76 mg/dL (ref 0.44–1.00)
GFR, Estimated: >60 mL/min (ref >60)
Glucose, Bld: 80 mg/dL (ref 70–99)
Potassium: 3.4 mmol/L — ABNORMAL LOW (ref 3.5–5.1)
Sodium: 138 mmol/L (ref 135–145)
Total Bilirubin: 0.7 mg/dL (ref 0.3–1.2)
Total Protein: 8.3 g/dL — ABNORMAL HIGH (ref 6.5–8.1)

## 2022-07-12 LAB — POC URINE PREG, ED: Preg Test, Ur: NEGATIVE

## 2022-07-12 NOTE — ED Triage Notes (Signed)
Pt states she is having neck pain for a week. Pt denies rash or known fever, pt is not able to bend chin down to chest. Pt appears in no acute distress.

## 2022-07-12 NOTE — ED Notes (Signed)
Pt stated for "forgot to mention a rash on my right breast." Rash is only on right breast and does not spread anywhere else. Pt stated she has had the rash for a "few weeks now."

## 2022-07-12 NOTE — ED Notes (Addendum)
Pt ambulatory to desk without distress or difficulty, full and appropriate ROM noted in all extremities, head and neck.  inquiring about wait times. Advised pt I cannot provide specific wait times, pt becomes upset questioning this nurse on what order patients are seen saying " this ear pain person got here after me and just went back." Attempted to advise pt that many factors go into what order pts are seen, and it is not always in order of arrival. Pt continues speaking inaudibly as she walks away from desk.

## 2022-07-13 ENCOUNTER — Emergency Department
Admission: EM | Admit: 2022-07-13 | Discharge: 2022-07-13 | Disposition: A | Discharge status: Home / Self Care | Payer: No Typology Code available for payment source | Attending: Emergency Medicine | Admitting: Emergency Medicine

## 2022-07-13 ENCOUNTER — Emergency Department: Payer: No Typology Code available for payment source

## 2022-07-13 DIAGNOSIS — M436 Torticollis: Secondary | ICD-10-CM

## 2022-07-13 MED ORDER — LIDOCAINE 5 % EX PTCH
1.0000 | MEDICATED_PATCH | Freq: Once | CUTANEOUS | Status: DC
  Administered 2022-07-13: 1 via TRANSDERMAL
  Filled 2022-07-13: qty 1

## 2022-07-13 MED ORDER — OXYCODONE-ACETAMINOPHEN 5-325 MG PO TABS: 2.0000 | ORAL_TABLET | Freq: Four times a day (QID) | ORAL | 0 refills | Status: AC | PRN

## 2022-07-13 MED ORDER — LIDOCAINE 5 % EX PTCH: 1.0000 | MEDICATED_PATCH | CUTANEOUS | 0 refills | Status: AC

## 2022-07-13 MED ORDER — KETOROLAC TROMETHAMINE 30 MG/ML IJ SOLN
60.0000 mg | Freq: Once | INTRAMUSCULAR | Status: AC
  Administered 2022-07-13: 60 mg via INTRAMUSCULAR
  Filled 2022-07-13: qty 2

## 2022-07-13 MED ORDER — IBUPROFEN 800 MG PO TABS
800.0000 mg | ORAL_TABLET | Freq: Three times a day (TID) | ORAL | 0 refills | Status: AC | PRN
Start: 2022-07-13 — End: ?

## 2022-07-13 MED ORDER — ONDANSETRON 4 MG PO TBDP
4.0000 mg | ORAL_TABLET | Freq: Four times a day (QID) | ORAL | 0 refills | Status: AC | PRN
Start: 2022-07-13 — End: ?

## 2022-07-13 NOTE — Discharge Instructions (Addendum)

## 2022-07-13 NOTE — ED Provider Notes (Addendum)
[facility] Provider Note    Event Date/Time   First MD Initiated Contact with Patient 07/13/22 [phone]     (approximate)   History   Neck Pain   HPI  Kaitlyn Fry is a 36 y.o. female with history of depression, anxiety, asthma who presents to the emergency department with left-sided neck and upper back pain for the past week.  Denies any known injury prior to the pain starting.  States that she cannot turn her head to the left.  States sometimes she will have numbness down the left arm but none currently.  No difficulty walking.  No weakness.  No bowel or bladder incontinence.  No urinary retention.  No fever.  No previous neck or back surgery.  No fever.  She states that several days ago that she had her daughter stand on her neck and back and felt a painful crack.  She is worried that something could be fracture.   History provided by patient.    Past Medical History:  Diagnosis Date   Anxiety    Asthma    Depression     Past Surgical History:  Procedure Laterality Date   TUBAL LIGATION      MEDICATIONS:  Prior to Admission medications   Medication Sig Start Date End Date Taking? Authorizing Provider  famotidine (PEPCID) 20 MG tablet Take 1 tablet (20 mg total) by mouth 2 (two) times daily. 11/14/20 12/14/20  Trey Sailors, PA-C  HYDROcodone-acetaminophen (NORCO/VICODIN) 5-325 MG tablet Take 1 tablet by mouth every 4 (four) hours as needed for moderate pain. 12/17/21 12/17/22  Minna Antis, MD  predniSONE (DELTASONE) 10 MG tablet Take 1 tablet (10 mg total) by mouth daily. Day 1-3: take 4 tablets PO daily Day 4-6: take 3 tablets PO daily Day 7-9: take 2 tablets PO daily Day 10-12: take 1 tablet PO daily 12/17/21   Minna Antis, MD  tinidazole (TINDAMAX) 500 MG tablet Take 4 tablets (2,000 mg total) by mouth daily with breakfast. For two days 06/22/20   Gunnar Bulla, CNM    Physical Exam   Triage Vital Signs: ED Triage Vitals   Enc Vitals Group     BP 07/12/22 1946 (!) 138/108     Pulse Rate 07/12/22 1946 90     Resp 07/12/22 1946 18     Temp 07/12/22 1946 99.1 F (37.3 C)     Temp Source 07/12/22 1946 Oral     SpO2 07/12/22 1946 99 %     Weight 07/12/22 1947 170 lb (77.1 kg)     Height 07/12/22 1947 5' (1.524 m)     Head Circumference --      Peak Flow --      Pain Score 07/12/22 1949 10     Pain Loc --      Pain Edu? --      Excl. in [place]? --     Most recent vital signs: Vitals:   07/13/22 0213 07/13/22 0546  BP: (!) 152/96 125/77  Pulse: 80 70  Resp: 20 18  Temp: 97.8 F (36.6 C)   SpO2: 98% 99%    CONSTITUTIONAL: Alert and oriented and responds appropriately to questions. Well-appearing; well-nourished HEAD: Normocephalic, atraumatic EYES: Conjunctivae clear, pupils appear equal, sclera nonicteric ENT: normal nose; moist mucous membranes NECK: Supple, normal ROM, tender to palpation over the left trapezius muscle without redness, warmth, soft tissue swelling, ecchymosis, rash or other lesion.  No midline tenderness on exam.  No step-off  or deformity.  Trachea midline.  No cervical lymphadenopathy. CARD: RRR; S1 and S2 appreciated; no murmurs, no clicks, no rubs, no gallops RESP: Normal chest excursion without splinting or tachypnea; breath sounds clear and equal bilaterally; no wheezes, no rhonchi, no rales, no hypoxia or respiratory distress, speaking full sentences ABD/GI: Normal bowel sounds; non-distended; soft, non-tender, no rebound, no guarding, no peritoneal signs BACK: The back appears normal, complains of thoracic back pain as well but no midline spinal tenderness, step-off or deformity. EXT: Normal ROM in all joints; no deformity noted, no edema; no cyanosis SKIN: Normal color for age and race; warm; no rash on exposed skin NEURO: Moves all extremities equally, normal speech, normal gait, normal sensation diffusely, no clonus or hyperreflexia PSYCH: The patient's mood and manner are  appropriate.   ED Results / Procedures / Treatments   LABS: (all labs ordered are listed, but only abnormal results are displayed) Labs Reviewed  COMPREHENSIVE METABOLIC PANEL - Abnormal; Notable for the following components:      Result Value   Potassium 3.4 (*)    Total Protein 8.3 (*)    All other components within normal limits  URINALYSIS, ROUTINE W REFLEX MICROSCOPIC - Abnormal; Notable for the following components:   Color, Urine YELLOW (*)    APPearance HAZY (*)    Ketones, ur 5 (*)    Leukocytes,Ua TRACE (*)    All other components within normal limits  CBC WITH DIFFERENTIAL/PLATELET  POC URINE PREG, ED  POC URINE PREG, ED     EKG:  RADIOLOGY: My personal review and interpretation of imaging: CT cervical and thoracic spine show no acute traumatic injury.  I have personally reviewed all radiology reports.   CT Thoracic Spine Wo Contrast  Result Date: 07/13/2022 CLINICAL DATA:  36 year old female with neck and back pain for 1 week. EXAM: CT THORACIC SPINE WITHOUT CONTRAST TECHNIQUE: Multidetector CT images of the thoracic were obtained using the standard protocol without intravenous contrast. RADIATION DOSE REDUCTION: This exam was performed according to the departmental dose-optimization program which includes automated exposure control, adjustment of the mA and/or kV according to patient size and/or use of iterative reconstruction technique. COMPARISON:  CT cervical spine today reported separately. CT Abdomen and Pelvis 06/22/2018. FINDINGS: Limited cervical spine imaging: Reported separately. Cervicothoracic junction alignment is within normal limits. Thoracic spine segmentation:  Normal. Alignment: Thoracic kyphosis is within normal limits. No significant scoliosis or spondylolisthesis. Vertebrae: Bone mineralization is within normal limits. Unchanged lower vertebral body height compared to the 2019 CT Abdomen and Pelvis. Thoracic vertebrae and visible posterior ribs appear  intact. No acute osseous abnormality identified. Paraspinal and other soft tissues: Visible central airways are patent. Mild respiratory motion, visible lungs appear clear. No evidence of pericardial or pleural effusion. Negative visible noncontrast mediastinum and upper abdomen. Thoracic paraspinal soft tissues are within normal limits. Disc levels: Normal for age. Minor thoracic disc degeneration with no CT evidence of thoracic spinal stenosis. IMPRESSION: 1. Negative CT appearance of the Thoracic Spine. 2. CT cervical spine today reported separately. Electronically Signed   By: Odessa Fleming M.D.   On: 07/13/2022 05:09   CT Cervical Spine Wo Contrast  Result Date: 07/13/2022 CLINICAL DATA:  Neck trauma. EXAM: CT CERVICAL SPINE WITHOUT CONTRAST TECHNIQUE: Multidetector CT imaging of the cervical spine was performed without intravenous contrast. Multiplanar CT image reconstructions were also generated. RADIATION DOSE REDUCTION: This exam was performed according to the departmental dose-optimization program which includes automated exposure control, adjustment of the mA  and/or kV according to patient size and/or use of iterative reconstruction technique. COMPARISON:  None Available. FINDINGS: Alignment: No signs of acute posttraumatic mild alignment of the cervical spine. Reversal of normal cervical lordosis may reflect muscle spasm or patient positioning. Skull base and vertebrae: No acute fracture or dislocation. Soft tissues and spinal canal: No prevertebral fluid or swelling. No visible canal hematoma. Disc levels: There is mild disc space narrowing and endplate spurring noted at C5-6, C6-7 and C7-T1. Upper chest: Negative. Other: None. IMPRESSION: 1. No evidence for acute posttraumatic injury to the cervical spine. 2. Reversal of normal cervical lordosis may reflect muscle spasm or patient positioning. 3. Mild cervical spondylosis. Electronically Signed   By: Signa Kell M.D.   On: 07/13/2022 05:04      PROCEDURES:  Critical Care performed: No     Procedures    IMPRESSION / MDM / ASSESSMENT AND PLAN / ED COURSE  I reviewed the triage vital signs and the nursing notes.    Patient here with torticollis.  She is concerned about a fracture in her neck, upper back due to feeling a painful crack after her daughter stepped on her back to try to help relieve her muscle spasm.  Intermittently having left arm numbness but none currently.    DIFFERENTIAL DIAGNOSIS (includes but not limited to):   Torticollis, cervical radiculopathy, doubt spinal stenosis, cervical myelopathy.  Fracture is on the differential.  Doubt meningitis, epidural abscess or hematoma, discitis or osteomyelitis.   Patient's presentation is most consistent with acute presentation with potential threat to life or bodily function.   PLAN: We will obtain CT of the cervical and thoracic spine.  We will give Toradol, Lidoderm patch.  Patient drove herself to the emergency department like to be able to drive home.  We discussed that we would not be able to give her sedatives here if she plans to drive home.  CBC, BMP, pregnancy test, urinalysis obtained from triage.  No leukocytosis.  Normal renal function and electrolytes.  Negative pregnancy test.   Patient's pain seems to be reproducible over the left trapezius muscle.  She states that her daughter used her foot to apply pressure to her left upper back.  No manipulation of the neck.  Low suspicion for dissection.  Neurologically intact currently.  MEDICATIONS GIVEN IN ED: Medications  lidocaine (LIDODERM) 5 % 1 patch (1 patch Transdermal Patch Applied 07/13/22 0415)  ketorolac (TORADOL) 30 MG/ML injection 60 mg (60 mg Intramuscular Given 07/13/22 0414)     ED COURSE: CT cervical and thoracic spine reviewed and interpreted by myself and the radiologist and shows no acute traumatic injury.  Will discharge home with ibuprofen, Lidoderm patches and Percocet.  Recommended  rest, ice and heat, stretching, gentle massage.  I do not feel she needs emergent MRI at this time.  Her neurologic exam is normal.   At this time, I do not feel there is any life-threatening condition present. I reviewed all nursing notes, vitals, pertinent previous records.  All lab and urine results, EKGs, imaging ordered have been independently reviewed and interpreted by myself.  I reviewed all available radiology reports from any imaging ordered this visit.  Based on my assessment, I feel the patient is safe to be discharged home without further emergent workup and can continue workup as an outpatient as needed. Discussed all findings, treatment plan as well as usual and customary return precautions.  They verbalize understanding and are comfortable with this plan.  Outpatient follow-up has  been provided as needed.  All questions have been answered.   CONSULTS: Patient is appropriate for further outpatient management and does not require admission at this time.   OUTSIDE RECORDS REVIEWED: [facility]ed [facility]23.       FINAL CLINICAL IMPRESSION(S) / ED DIAGNOSES   Final diagnoses:  Torticollis, acute     Rx / DC Orders   ED Discharge Orders          Ordered    ibuprofen (ADVIL) 800 MG tablet  Every 8 hours PRN        07/13/22 0533    lidocaine (LIDODERM) 5 %  Every 24 hours        07/13/22 0533    oxyCODONE-acetaminophen (PERCOCET) 5-325 MG tablet  Every 6 hours PRN        07/13/22 0533    ondansetron (ZOFRAN-ODT) 4 MG disintegrating tablet  Every 6 hours PRN        07/13/22 0533             Note:  This document was prepared using Dragon voice recognition software and may include unintentional dictation errors.   Kalvyn Desa, Layla Maw, DO 07/13/22 0648    Amaro Mangold, Layla Maw, DO 07/13/22 [phone]

## 2022-07-13 NOTE — ED Notes (Signed)
Pt to desk repeatedly asking about wait times. Advised pt again that pts are not seen in order of arrival and multiple things happen that may alter wait times. Pt becoming frustrated with staff, questioning why some pts are receiving xrays and she is not when she has asked repeatedly for one. I advised pt that this nurse personally discussed with Md Darnelle Catalan after she approached the desk earlier to evaluate need for imaging and provider at that time preferred to exam pt prior to ordering xrays.  Pt continues to question and become frustrated. Advised pt that this nurse understands her concerns and every attempt has been made to expedite wait times for all patients tonight. Pt continues to express frustration as she walks away from desk.

## 2022-07-23 DIAGNOSIS — M542 Cervicalgia: Secondary | ICD-10-CM | POA: Diagnosis not present

## 2022-07-23 DIAGNOSIS — M2142 Flat foot [pes planus] (acquired), left foot: Secondary | ICD-10-CM | POA: Diagnosis not present

## 2022-07-23 DIAGNOSIS — G8929 Other chronic pain: Secondary | ICD-10-CM | POA: Diagnosis not present

## 2022-07-23 DIAGNOSIS — N939 Abnormal uterine and vaginal bleeding, unspecified: Secondary | ICD-10-CM | POA: Diagnosis not present

## 2022-08-01 DIAGNOSIS — N6310 Unspecified lump in the right breast, unspecified quadrant: Secondary | ICD-10-CM | POA: Diagnosis not present

## 2022-08-01 DIAGNOSIS — F419 Anxiety disorder, unspecified: Secondary | ICD-10-CM | POA: Diagnosis not present

## 2022-08-01 DIAGNOSIS — R21 Rash and other nonspecific skin eruption: Secondary | ICD-10-CM | POA: Diagnosis not present

## 2022-08-01 DIAGNOSIS — J45909 Unspecified asthma, uncomplicated: Secondary | ICD-10-CM | POA: Diagnosis not present

## 2022-08-30 DIAGNOSIS — G43909 Migraine, unspecified, not intractable, without status migrainosus: Secondary | ICD-10-CM | POA: Diagnosis not present

## 2022-08-30 DIAGNOSIS — F419 Anxiety disorder, unspecified: Secondary | ICD-10-CM | POA: Diagnosis not present

## 2022-08-30 DIAGNOSIS — N921 Excessive and frequent menstruation with irregular cycle: Secondary | ICD-10-CM | POA: Diagnosis not present

## 2022-08-30 DIAGNOSIS — R1909 Other intra-abdominal and pelvic swelling, mass and lump: Secondary | ICD-10-CM | POA: Diagnosis not present

## 2022-09-25 DIAGNOSIS — M542 Cervicalgia: Secondary | ICD-10-CM | POA: Diagnosis not present

## 2022-09-25 DIAGNOSIS — R195 Other fecal abnormalities: Secondary | ICD-10-CM | POA: Diagnosis not present

## 2022-09-25 DIAGNOSIS — F4310 Post-traumatic stress disorder, unspecified: Secondary | ICD-10-CM | POA: Diagnosis not present

## 2022-09-25 DIAGNOSIS — F419 Anxiety disorder, unspecified: Secondary | ICD-10-CM | POA: Diagnosis not present

## 2022-09-25 DIAGNOSIS — N907 Vulvar cyst: Secondary | ICD-10-CM | POA: Diagnosis not present

## 2022-09-25 DIAGNOSIS — Z1151 Encounter for screening for human papillomavirus (HPV): Secondary | ICD-10-CM | POA: Diagnosis not present

## 2022-09-25 DIAGNOSIS — Z124 Encounter for screening for malignant neoplasm of cervix: Secondary | ICD-10-CM | POA: Diagnosis not present

## 2023-02-02 ENCOUNTER — Other Ambulatory Visit: Payer: Self-pay

## 2023-02-02 ENCOUNTER — Emergency Department
Admission: EM | Admit: 2023-02-02 | Discharge: 2023-02-02 | Disposition: A | Discharge status: Home / Self Care | Payer: No Typology Code available for payment source | Attending: Emergency Medicine | Admitting: Emergency Medicine

## 2023-02-02 DIAGNOSIS — M542 Cervicalgia: Secondary | ICD-10-CM

## 2023-02-02 DIAGNOSIS — X509XXA Other and unspecified overexertion or strenuous movements or postures, initial encounter: Secondary | ICD-10-CM | POA: Insufficient documentation

## 2023-02-02 DIAGNOSIS — S161XXA Strain of muscle, fascia and tendon at neck level, initial encounter: Secondary | ICD-10-CM | POA: Insufficient documentation

## 2023-02-02 DIAGNOSIS — M25511 Pain in right shoulder: Secondary | ICD-10-CM

## 2023-02-02 MED ORDER — KETOROLAC TROMETHAMINE 10 MG PO TABS: 10.0000 mg | ORAL_TABLET | Freq: Four times a day (QID) | ORAL | 0 refills | Status: AC | PRN

## 2023-02-02 MED ORDER — CYCLOBENZAPRINE HCL 10 MG PO TABS: 10.0000 mg | ORAL_TABLET | Freq: Three times a day (TID) | ORAL | 0 refills | Status: AC | PRN

## 2023-02-02 NOTE — Discharge Instructions (Addendum)
Please use ibuprofen (Motrin) up to 800 mg every 8 hours, naproxen (Naprosyn) up to 500 mg every 12 hours, and/or acetaminophen (Tylenol) up to 4 g/day for any continued pain.  Please do not use this medication regimen for longer than 7 days 

## 2023-02-02 NOTE — ED Provider Notes (Signed)
[facility] Provider Note   Event Date/Time   First MD Initiated Contact with Patient 02/02/23 1105     (approximate) History  Shoulder Pain  HPI Monic Plumadore is a 37 y.o. female who presents for right neck and shoulder pain with associated numbness and occasional paresthesias that been present over the last week and have been worsening since onset.  Patient describes a 10/10 pain that radiates down the back of her right arm into the last 3 digits of the right hand.  Patient states that this pain is made worse by movement especially lying down or allowing the shoulder to relax completely and the arm to hang freely.  Patient denies any relieving factors and only states that naproxen has minimal effect on improving pain.  Patient denies any trauma and states that she woke up with this pain initially that has been worsening since onset.  Patient works on a [employer] and states that she occasionally has poor posture that may have led to this worsening neck pain. ROS: Patient currently denies any fever, vision changes, tinnitus, difficulty speaking, facial droop, sore throat, chest pain, shortness of breath, abdominal pain, nausea/vomiting/diarrhea, dysuria, or weakness/numbness in any extremity   Physical Exam  Triage Vital Signs: ED Triage Vitals  Enc Vitals Group     BP 02/02/23 1020 126/74     Pulse Rate 02/02/23 1020 70     Resp 02/02/23 1020 18     Temp 02/02/23 1020 98.2 F (36.8 C)     Temp src --      SpO2 02/02/23 1020 98 %     Weight 02/02/23 1022 165 lb (74.8 kg)     Height 02/02/23 1022 5' (1.524 m)     Head Circumference --      Peak Flow --      Pain Score 02/02/23 1021 10     Pain Loc --      Pain Edu? --      Excl. in [place]? --    Most recent vital signs: Vitals:   02/02/23 1020  BP: 126/74  Pulse: 70  Resp: 18  Temp: 98.2 F (36.8 C)  SpO2: 98%   General: Awake, oriented x4. CV:  Good peripheral perfusion.  Resp:  Normal effort.   Abd:  No distention.  Other:  Middle-aged overweight African-American female laying in bed in mild distress secondary to pain.  Positive Spurling test on the right.  Mild decreased sensation over the posterior of the right upper extremity as well as the last 2 digits of the right hand.  Grip strength intact.  Full range of motion intact at the shoulder with moderate pain. ED Results / Procedures / Treatments  PROCEDURES: Critical Care performed: No Procedures MEDICATIONS ORDERED IN ED: Medications - No data to display IMPRESSION / MDM / [place] / ED COURSE  I reviewed the triage vital signs and the nursing notes.                             Patient's presentation is most consistent with acute presentation with potential threat to life or bodily function. 37 year old female presents for worsening right shoulder and neck pain Given history, exam and workup I have low suspicion for fracture, dislocation, significant ligamentous injury, septic arthritis, gout flare, new autoimmune arthropathy, or gonococcal arthropathy.  Interventions: Prescription for Toradol, cyclobenzaprine, and physical therapy rehabilitation exercises provided. Disposition: Discharge home with strict return  precautions and instructions for prompt primary care follow up in the next week.   FINAL CLINICAL IMPRESSION(S) / ED DIAGNOSES   Final diagnoses:  Acute pain of right shoulder  Neck pain on right side  Cervical muscle strain, initial encounter   Rx / DC Orders   ED Discharge Orders          Ordered    cyclobenzaprine (FLEXERIL) 10 MG tablet  3 times daily PRN        02/02/23 1135    ketorolac (TORADOL) 10 MG tablet  Every 6 hours PRN       Note to Pharmacy: Patient given an IM/IV loading dose in emergency department   02/02/23 1135           Note:  This document was prepared using Dragon voice recognition software and may include unintentional dictation errors.   Naaman Plummer,  MD 02/02/23 [phone]

## 2023-02-02 NOTE — ED Triage Notes (Signed)
Pt states right shoulder pain with numbness for 1 week. Pt states it gets worse at night when trying to sleep. Pt denies any known trauma. Pt states similar episode last time, this is just worse.

## 2023-03-19 DIAGNOSIS — Z8619 Personal history of other infectious and parasitic diseases: Secondary | ICD-10-CM | POA: Diagnosis not present

## 2023-06-17 DIAGNOSIS — N939 Abnormal uterine and vaginal bleeding, unspecified: Secondary | ICD-10-CM | POA: Diagnosis not present

## 2023-06-17 DIAGNOSIS — K645 Perianal venous thrombosis: Secondary | ICD-10-CM | POA: Diagnosis not present

## 2023-06-17 DIAGNOSIS — R109 Unspecified abdominal pain: Secondary | ICD-10-CM | POA: Diagnosis not present

## 2023-06-17 DIAGNOSIS — N920 Excessive and frequent menstruation with regular cycle: Secondary | ICD-10-CM | POA: Diagnosis not present

## 2023-06-24 DIAGNOSIS — D252 Subserosal leiomyoma of uterus: Secondary | ICD-10-CM | POA: Diagnosis not present

## 2023-06-24 DIAGNOSIS — K649 Unspecified hemorrhoids: Secondary | ICD-10-CM | POA: Diagnosis not present

## 2023-07-11 DIAGNOSIS — R102 Pelvic and perineal pain: Secondary | ICD-10-CM | POA: Diagnosis not present

## 2023-07-11 DIAGNOSIS — N941 Unspecified dyspareunia: Secondary | ICD-10-CM | POA: Diagnosis not present

## 2023-07-11 DIAGNOSIS — D259 Leiomyoma of uterus, unspecified: Secondary | ICD-10-CM | POA: Diagnosis not present

## 2023-08-18 ENCOUNTER — Other Ambulatory Visit: Payer: Self-pay

## 2023-08-18 ENCOUNTER — Emergency Department
Admission: EM | Admit: 2023-08-18 | Discharge: 2023-08-18 | Disposition: A | Discharge status: Home / Self Care | Payer: No Typology Code available for payment source | Attending: Emergency Medicine | Admitting: Emergency Medicine

## 2023-08-18 DIAGNOSIS — Y93G1 Activity, food preparation and clean up: Secondary | ICD-10-CM | POA: Diagnosis not present

## 2023-08-18 DIAGNOSIS — S61412A Laceration without foreign body of left hand, initial encounter: Secondary | ICD-10-CM | POA: Diagnosis present

## 2023-08-18 DIAGNOSIS — W25XXXA Contact with sharp glass, initial encounter: Secondary | ICD-10-CM | POA: Diagnosis not present

## 2023-08-18 MED ORDER — LIDOCAINE HCL (PF) 1 % IJ SOLN
5.0000 mL | Freq: Once | INTRAMUSCULAR | Status: AC
  Administered 2023-08-18: 5 mL
  Filled 2023-08-18: qty 5

## 2023-08-18 NOTE — ED Triage Notes (Signed)
Pt reports washing dishes tonight and cutting left wrist on broken glass in sink. Bleeding controlled.

## 2023-08-18 NOTE — ED Provider Notes (Signed)
[facility] Emergency Department Provider Note     Event Date/Time   First MD Initiated Contact with Patient 08/18/23 2155     (approximate)   History   Laceration   HPI  Kaitlyn Fry is a 37 y.o. female with noncontributory medical history, presents to the ED for evaluation of accidental laceration to the left hand at the wrist.  She was washing dishes at home, and cut her hand on a piece of broken glass in the sink.  Patient presents to the ED for management of her wound with bleeding controlled.  Physical Exam   Triage Vital Signs: ED Triage Vitals  Encounter Vitals Group     BP 08/18/23 2100 127/79     Systolic BP Percentile --      Diastolic BP Percentile --      Pulse Rate 08/18/23 2100 96     Resp 08/18/23 2100 18     Temp 08/18/23 2100 98.4 F (36.9 C)     Temp Source 08/18/23 2100 Oral     SpO2 08/18/23 2100 99 %     Weight 08/18/23 2059 174 lb (78.9 kg)     Height 08/18/23 2059 5' (1.524 m)     Head Circumference --      Peak Flow --      Pain Score 08/18/23 2059 10     Pain Loc --      Pain Education --      Exclude from Growth Chart --     Most recent vital signs: Vitals:   08/18/23 2100  BP: 127/79  Pulse: 96  Resp: 18  Temp: 98.4 F (36.9 C)  SpO2: 99%    General Awake, no distress. Anxous, tearful CV:  Good peripheral perfusion.  RESP:  Normal effort.  ABD:  No distention.  MSK:  Left hand with a laceration over the thenar aspect of the hand at the wrist.  Normal composite fist noted.   ED Results / Procedures / Treatments   Labs (all labs ordered are listed, but only abnormal results are displayed) Labs Reviewed - No data to display   EKG   RADIOLOGY   No results found.   PROCEDURES:  Critical Care performed: No  ..Laceration Repair  Date/Time: 08/18/2023 10:45 PM  Performed by: Lissa Hoard, PA-C Authorized by: Lissa Hoard, PA-C   Consent:    Consent obtained:   Verbal   Consent given by:  Patient   Risks, benefits, and alternatives were discussed: yes     Risks discussed:  Pain and poor wound healing Universal protocol:    Site/side marked: yes     Patient identity confirmed:  Verbally with patient Laceration details:    Length (cm):  2.5   Depth (mm):  5 Pre-procedure details:    Preparation:  Patient was prepped and draped in usual sterile fashion Exploration:    Limited defect created (wound extended): no     Hemostasis achieved with:  Direct pressure   Contaminated: no   Treatment:    Area cleansed with:  Povidone-iodine and saline   Amount of cleaning:  Standard   Irrigation solution:  Sterile saline   Irrigation volume:  10   Irrigation method:  Syringe   Debridement:  None   Undermining:  None   Scar revision: no   Skin repair:    Repair method:  Sutures   Suture size:  4-0   Suture material:  Nylon  Suture technique:  Simple interrupted   Number of sutures:  3 Approximation:    Approximation:  Close Repair type:    Repair type:  Simple Post-procedure details:    Dressing:  Non-adherent dressing   Procedure completion:  Tolerated well, no immediate complications    MEDICATIONS ORDERED IN ED: Medications  lidocaine (PF) (XYLOCAINE) 1 % injection 5 mL (5 mLs Infiltration Given by Other 08/18/23 2306)     IMPRESSION / MDM / ASSESSMENT AND PLAN / ED COURSE  I reviewed the triage vital signs and the nursing notes.                              Differential diagnosis includes, but is not limited to, laceration, abrasion, contusion  Patient's presentation is most consistent with acute, uncomplicated illness.  Patient's diagnosis is consistent with accidental laceration to the left palm.  Patient consents to wound repair which was performed with sutures with good wound edge approximation.  Patient will be discharged home with wound care instructions and supplies. Patient is to follow up with her PCP or local urgent care  for suture removal in 10 days, as discussed, as needed or otherwise directed. Patient is given ED precautions to return to the ED for any worsening or new symptoms.     FINAL CLINICAL IMPRESSION(S) / ED DIAGNOSES   Final diagnoses:  Laceration of left hand without foreign body, initial encounter     Rx / DC Orders   ED Discharge Orders     None        Note:  This document was prepared using Dragon voice recognition software and may include unintentional dictation errors.    Lissa Hoard, PA-C 08/23/23 0047    Loleta Rose, MD 09/03/23 1622

## 2023-08-18 NOTE — Discharge Instructions (Signed)
Keep the wound clean, dry, and covered. Change the dressing daily. Take OTC Tylenol or Motrin as needed for pain. See your provider for suture removal in 7-10 days.

## 2023-08-20 DIAGNOSIS — S61412A Laceration without foreign body of left hand, initial encounter: Secondary | ICD-10-CM | POA: Diagnosis not present

## 2023-08-20 DIAGNOSIS — W25XXXA Contact with sharp glass, initial encounter: Secondary | ICD-10-CM | POA: Diagnosis not present

## 2023-09-09 DIAGNOSIS — Z87828 Personal history of other (healed) physical injury and trauma: Secondary | ICD-10-CM | POA: Diagnosis not present

## 2023-09-09 DIAGNOSIS — Z4802 Encounter for removal of sutures: Secondary | ICD-10-CM | POA: Diagnosis not present

## 2023-09-17 ENCOUNTER — Telehealth: Payer: Self-pay

## 2023-09-17 NOTE — Telephone Encounter (Signed)
Transition Care Management Unsuccessful Follow-up Telephone Call  Date of discharge and from where:  [place] 10/6   Attempts:  1st Attempt  Reason for unsuccessful TCM follow-up call:  No answer/busy   [facility]  [facility] Guide, Phone: [phone] Website: [email or web address]

## 2023-09-18 ENCOUNTER — Telehealth: Payer: Self-pay

## 2023-09-18 NOTE — Telephone Encounter (Signed)
Transition Care Management Follow-up Telephone Call Date of discharge and from where: [name] 10/7 How have you been since you were released from the hospital? Doing fine and has followed up with providers Any questions or concerns? No  Items Reviewed: Did the pt receive and understand the discharge instructions provided? Yes  Medications obtained and verified? Yes  Other? No  Any new allergies since your discharge? No  Dietary orders reviewed? No Do you have support at home? Yes     Follow up appointments reviewed:  PCP Hospital f/u appt confirmed? Yes  Scheduled to see  on  @ . Specialist Hospital f/u appt confirmed? No  Scheduled to see  on  @ . Are transportation arrangements needed? No  If their condition worsens, is the pt aware to call PCP or go to the Emergency Dept.? Yes Was the patient provided with contact information for the PCP's office or ED? Yes Was to pt encouraged to call back with questions or concerns? Yes

## 2024-12-11 ENCOUNTER — Emergency Department

## 2024-12-11 ENCOUNTER — Emergency Department
Admission: EM | Admit: 2024-12-11 | Discharge: 2024-12-11 | Disposition: A | Discharge status: Home / Self Care | Attending: Emergency Medicine | Admitting: Emergency Medicine

## 2024-12-11 ENCOUNTER — Other Ambulatory Visit: Payer: Self-pay

## 2024-12-11 DIAGNOSIS — S00531A Contusion of lip, initial encounter: Secondary | ICD-10-CM | POA: Insufficient documentation

## 2024-12-11 DIAGNOSIS — Y92410 Unspecified street and highway as the place of occurrence of the external cause: Secondary | ICD-10-CM | POA: Insufficient documentation

## 2024-12-11 DIAGNOSIS — S0083XA Contusion of other part of head, initial encounter: Secondary | ICD-10-CM

## 2024-12-11 DIAGNOSIS — S0990XA Unspecified injury of head, initial encounter: Secondary | ICD-10-CM | POA: Insufficient documentation

## 2024-12-11 DIAGNOSIS — S161XXA Strain of muscle, fascia and tendon at neck level, initial encounter: Secondary | ICD-10-CM | POA: Insufficient documentation

## 2024-12-11 MED ORDER — CYCLOBENZAPRINE HCL 10 MG PO TABS: 10.0000 mg | ORAL_TABLET | Freq: Three times a day (TID) | ORAL | 0 refills | Status: AC | PRN

## 2024-12-11 MED ORDER — MELOXICAM 15 MG PO TABS: 15.0000 mg | ORAL_TABLET | Freq: Every day | ORAL | 0 refills | Status: AC

## 2024-12-11 MED ORDER — IBUPROFEN 600 MG PO TABS
600.0000 mg | ORAL_TABLET | Freq: Once | ORAL | Status: AC
  Administered 2024-12-11: 600 mg via ORAL
  Filled 2024-12-11: qty 1
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
## 2024-12-11 NOTE — Discharge Instructions (Signed)
 If he continued to have tooth pain in the next few days she may want to follow-up with a dentist.  We did not do the panoramic x-rays here but there was nothing abnormal noted on your CTs.  We did a CT of your head, your C-spine, and your face.
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
